# Patient Record
Sex: Female | Born: 1985 | Race: Black or African American | Hispanic: No | Marital: Single | Smoking: Never smoker
Health system: Southern US, Community
[De-identification: ages and names within clinical notes are randomized; demographics above are authoritative.]

## PROBLEM LIST (undated history)

## (undated) DIAGNOSIS — J45909 Unspecified asthma, uncomplicated: Secondary | ICD-10-CM

## (undated) DIAGNOSIS — R569 Unspecified convulsions: Secondary | ICD-10-CM

## (undated) HISTORY — DX: Morbid (severe) obesity due to excess calories: E66.01

## (undated) HISTORY — DX: Unspecified convulsions: R56.9

## (undated) HISTORY — DX: Unspecified asthma, uncomplicated: J45.909

---
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
## 2006-04-26 ENCOUNTER — Emergency Department (HOSPITAL_COMMUNITY): Admission: EM | Admit: 2006-04-26 | Discharge: 2006-04-26 | Payer: Self-pay | Admitting: Emergency Medicine

## 2007-04-06 ENCOUNTER — Emergency Department (HOSPITAL_COMMUNITY): Admission: EM | Admit: 2007-04-06 | Discharge: 2007-04-06 | Payer: Self-pay | Admitting: Emergency Medicine

## 2007-04-07 ENCOUNTER — Emergency Department (HOSPITAL_COMMUNITY): Admission: EM | Admit: 2007-04-07 | Discharge: 2007-04-07 | Payer: Self-pay | Admitting: Emergency Medicine

## 2008-11-05 ENCOUNTER — Emergency Department (HOSPITAL_COMMUNITY): Admission: EM | Admit: 2008-11-05 | Discharge: 2008-11-05 | Payer: Self-pay | Admitting: Family Medicine

## 2008-11-06 ENCOUNTER — Emergency Department (HOSPITAL_COMMUNITY): Admission: EM | Admit: 2008-11-06 | Discharge: 2008-11-06 | Payer: Self-pay | Admitting: Family Medicine

## 2011-06-20 ENCOUNTER — Encounter: Payer: Self-pay | Admitting: Internal Medicine

## 2011-06-20 DIAGNOSIS — Z0000 Encounter for general adult medical examination without abnormal findings: Secondary | ICD-10-CM | POA: Insufficient documentation

## 2011-06-24 ENCOUNTER — Other Ambulatory Visit (INDEPENDENT_AMBULATORY_CARE_PROVIDER_SITE_OTHER): Payer: BC Managed Care – PPO

## 2011-06-24 ENCOUNTER — Ambulatory Visit (INDEPENDENT_AMBULATORY_CARE_PROVIDER_SITE_OTHER): Payer: BC Managed Care – PPO | Admitting: Internal Medicine

## 2011-06-24 ENCOUNTER — Encounter: Payer: Self-pay | Admitting: Internal Medicine

## 2011-06-24 VITALS — BP 118/78 | HR 75 | Temp 98.5°F | Ht 67.0 in | Wt 339.2 lb

## 2011-06-24 DIAGNOSIS — Z0000 Encounter for general adult medical examination without abnormal findings: Secondary | ICD-10-CM

## 2011-06-24 DIAGNOSIS — R569 Unspecified convulsions: Secondary | ICD-10-CM | POA: Insufficient documentation

## 2011-06-24 HISTORY — DX: Morbid (severe) obesity due to excess calories: E66.01

## 2011-06-24 LAB — URINALYSIS, ROUTINE W REFLEX MICROSCOPIC
Bilirubin Urine: NEGATIVE
Ketones, ur: NEGATIVE
Total Protein, Urine: NEGATIVE
Urine Glucose: NEGATIVE
pH: 7 (ref 5.0–8.0)

## 2011-06-24 LAB — TSH: TSH: 1.8 u[IU]/mL (ref 0.35–5.50)

## 2011-06-24 LAB — LIPID PANEL
HDL: 49.1 mg/dL (ref >39.00)
Total CHOL/HDL Ratio: 4
Triglycerides: 101 mg/dL (ref 0.0–149.0)

## 2011-06-24 LAB — CBC WITH DIFFERENTIAL/PLATELET
Basophils Relative: 0.5 % (ref 0.0–3.0)
Eosinophils Relative: 1.6 % (ref 0.0–5.0)
HCT: 38.5 % (ref 36.0–46.0)
Lymphs Abs: 2.6 10*3/uL (ref 0.7–4.0)
MCV: 83.7 fl (ref 78.0–100.0)
Monocytes Absolute: 0.5 10*3/uL (ref 0.1–1.0)
Platelets: 210 10*3/uL (ref 150.0–400.0)
WBC: 9 10*3/uL (ref 4.5–10.5)

## 2011-06-24 LAB — HEPATIC FUNCTION PANEL
Albumin: 3.8 g/dL (ref 3.5–5.2)
Bilirubin, Direct: 0 mg/dL (ref 0.0–0.3)
Total Protein: 7.4 g/dL (ref 6.0–8.3)

## 2011-06-24 LAB — BASIC METABOLIC PANEL
CO2: 29 mEq/L (ref 19–32)
Calcium: 9.3 mg/dL (ref 8.4–10.5)
Creatinine, Ser: 0.9 mg/dL (ref 0.4–1.2)

## 2011-06-24 MED ORDER — PHENTERMINE HCL 37.5 MG PO CAPS: 37.5000 mg | ORAL_CAPSULE | ORAL | Status: DC

## 2011-06-24 NOTE — Progress Notes (Signed)
Subjective:    Patient ID: Alexis Vega, female    DOB: 03-Jan-1986, 26 y.o.   MRN: [id number]  HPI  Here to establish as new pt; has not had health ins until recent, had first pap smear dec 2012.  Unable to lose wt; has been approx 340 since approx 26 yo. Has tried  Sensa, zumba, walking 3 times per wk, exercise o/w 5 times per wk, tyring to eat more healthy, still cant lose. Does not want surgury.  Has not had thyroid check.   Overallo/w  doing ok;  Pt denies CP, worsening SOB, DOE, wheezing, orthopnea, PND, worsening LE edema, palpitations, dizziness or syncope.  Pt denies neurological change such as new Headache, facial or extremity weakness.  Pt denies polydipsia, polyuria, or low sugar symptoms. Pt states overall good compliance with treatment and medications, good tolerability, and trying to follow lower cholesterol diet.  Pt denies worsening depressive symptoms, suicidal ideation or panic. No fever, wt loss, night sweats, loss of appetite, or other constitutional symptoms.  Pt states good ability with ADL's, low fall risk, home safety reviewed and adequate, no significant changes in hearing or vision, and occasionally active with exercise. Not pregnant Past Medical History  Diagnosis Date  . Seizures     last siezure 26yo  . Morbid obesity 06/24/2011   History reviewed. No pertinent past surgical history.  reports that she has never smoked. She does not have any smokeless tobacco history on file. She reports that she drinks alcohol. She reports that she does not use illicit drugs. family history includes Seizures in her father. No Known Allergies No current outpatient prescriptions on file prior to visit.   Review of Systems Review of Systems  Constitutional: Negative for diaphoresis, activity change, appetite change and unexpected weight change.  HENT: Negative for hearing loss, ear pain, facial swelling, mouth sores and neck stiffness.   Eyes: Negative for pain, redness and visual  disturbance.  Respiratory: Negative for shortness of breath and wheezing.   Cardiovascular: Negative for chest pain and palpitations.  Gastrointestinal: Negative for diarrhea, blood in stool, abdominal distention and rectal pain.  Genitourinary: Negative for hematuria, flank pain and decreased urine volume.  Musculoskeletal: Negative for myalgias and joint swelling.  Skin: Negative for color change and wound.  Neurological: Negative for syncope and numbness.  Hematological: Negative for adenopathy.  Psychiatric/Behavioral: Negative for hallucinations, self-injury, decreased concentration and agitation.      Objective:   Physical Exam BP 118/78  Pulse 75  Temp(Src) 98.5 F (36.9 C) (Oral)  Ht 5\' 7"  (1.702 m)  Wt 339 lb 4 oz (153.883 kg)  BMI 53.13 kg/m2  SpO2 98%  LMP 06/15/2011 Physical Exam  VS noted Constitutional: Pt is oriented to person, place, and time. Appears well-developed and well-nourished.  HENT:  Head: Normocephalic and atraumatic.  Right Ear: External ear normal.  Left Ear: External ear normal.  Nose: Nose normal.  Mouth/Throat: Oropharynx is clear and moist.  Eyes: Conjunctivae and EOM are normal. Pupils are equal, round, and reactive to light.  Neck: Normal range of motion. Neck supple. No JVD present. No tracheal deviation present.  Cardiovascular: Normal rate, regular rhythm, normal heart sounds and intact distal pulses.   Pulmonary/Chest: Effort normal and breath sounds normal.  Abdominal: Soft. Bowel sounds are normal. There is no tenderness.  Musculoskeletal: Normal range of motion. Exhibits no edema.  Lymphadenopathy:  Has no cervical adenopathy.  Neurological: Pt is alert and oriented to person, place, and time. Pt has  normal reflexes. No cranial nerve deficit.  Skin: Skin is warm and dry. No rash noted.  Psychiatric:  Has  normal mood and affect. Behavior is normal.     Assessment & Plan:

## 2011-06-24 NOTE — Patient Instructions (Signed)
Take all new medications as prescribed Please go to LAB in the Basement for the blood and/or urine tests to be done today Please call the phone number [phone] (the PhoneTree System) for results of testing in 2-3 days;  When calling, simply dial the number, and when prompted enter the MRN number above (the Medical Record Number) and the # key, then the message should start. Please focus on increased activity, low cholesterol diet, and weight loss

## 2011-06-28 ENCOUNTER — Encounter: Payer: Self-pay | Admitting: Internal Medicine

## 2011-06-28 NOTE — Assessment & Plan Note (Signed)

## 2011-06-28 NOTE — Assessment & Plan Note (Signed)
Ok for limited phentermine asd, cont wt loss efforts with diet and excericse

## 2011-09-24 ENCOUNTER — Telehealth: Payer: Self-pay

## 2011-09-24 NOTE — Telephone Encounter (Signed)
Sorry, as discussed at her last visit, this is not meant to be a long term medication, and we have to stop due to risk of long term side effects

## 2011-09-24 NOTE — Telephone Encounter (Signed)
Patient would like refill on phentermine as states is working well. [facility] [address] Call back number [phone]

## 2011-09-24 NOTE — Telephone Encounter (Signed)
Patient informed of MD's instructions

## 2012-04-11 ENCOUNTER — Ambulatory Visit (INDEPENDENT_AMBULATORY_CARE_PROVIDER_SITE_OTHER): Payer: BC Managed Care – PPO | Admitting: Internal Medicine

## 2012-04-11 ENCOUNTER — Encounter: Payer: Self-pay | Admitting: Internal Medicine

## 2012-04-11 VITALS — BP 112/74 | HR 76 | Temp 98.7°F | Ht 67.5 in | Wt 290.2 lb

## 2012-04-11 DIAGNOSIS — R21 Rash and other nonspecific skin eruption: Secondary | ICD-10-CM | POA: Insufficient documentation

## 2012-04-11 MED ORDER — CLOTRIMAZOLE-BETAMETHASONE 1-0.05 % EX CREA: TOPICAL_CREAM | CUTANEOUS | Status: DC

## 2012-04-11 NOTE — Progress Notes (Signed)
  Subjective:    Patient ID: Alexis Vega, female    DOB: [birth year], 26 y.o.   MRN: [id number]  HPI  Here with mild 3 days onset itchy rash to the area below the left breast, with severa itchy bumps, no pain/fever/drainage or prior hx.  Has not tried any oTC med, nothing makes better or worse.  No obvious contact allergens.  Cousin recently tx for scabies, which has her concerned.  No other site of itch or rash Past Medical History  Diagnosis Date  . Seizures     last siezure 26yo  . Morbid obesity 06/24/2011   No past surgical history on file.  reports that she has never smoked. She does not have any smokeless tobacco history on file. She reports that she drinks alcohol. She reports that she does not use illicit drugs. family history includes Seizures in her father. No Known Allergies Current Outpatient Prescriptions on File Prior to Visit  Medication Sig Dispense Refill  . phentermine 37.5 MG capsule Take 1 capsule (37.5 mg total) by mouth every morning.  30 capsule  2   Review of Systems All otherwise neg per pt     Objective:   Physical Exam BP 112/74  Pulse 76  Temp 98.7 F (37.1 C) (Oral)  Ht 5' 7.5" (1.715 m)  Wt 290 lb 4 oz (131.657 kg)  BMI 44.79 kg/m2  SpO2 99% Physical Exam  VS noted Constitutional: Pt appears well-developed and well-nourished.  HENT: Head: Normocephalic.  Right Ear: External ear normal.  Left Ear: External ear normal.  Eyes: Conjunctivae and EOM are normal. Pupils are equal, round, and reactive to light.  Neck: Normal range of motion. Neck supple.  Cardiovascular: Normal rate and regular rhythm.   Pulmonary/Chest: Effort normal and breath sounds normal.  Neurological: Pt is alert. Not confused  Skin: 3 cm sq area below left breast with mild erythema, nontender with several nonpustular slight raised red 2-3 mm lesions Psychiatric: Pt behavior is normal. Thought content normal.     Assessment & Plan:

## 2012-04-11 NOTE — Assessment & Plan Note (Signed)
?   Contact vs other - for lotrisone cr prn asd,  to f/u any worsening symptoms or concerns, consider nystatin powder if not improved in 3-5 days

## 2012-04-11 NOTE — Patient Instructions (Addendum)
Take all new medications as prescribed Continue all other medications as before Please call for a change to nystatin powder if the rash is not better in 3-5 days Please remember to sign up for My Chart at your earliest convenience, as this will be important to you in the future with finding out test results. Please continue your efforts at being more active, low cholesterol diet, and weight control.

## 2012-07-30 ENCOUNTER — Other Ambulatory Visit: Payer: Self-pay

## 2012-08-26 ENCOUNTER — Encounter: Payer: BC Managed Care – PPO | Admitting: Internal Medicine

## 2012-09-13 ENCOUNTER — Encounter: Payer: BC Managed Care – PPO | Admitting: Internal Medicine

## 2012-10-24 ENCOUNTER — Emergency Department (HOSPITAL_COMMUNITY)
Admission: EM | Admit: 2012-10-24 | Discharge: 2012-10-24 | Disposition: A | Discharge status: Home / Self Care | Payer: BC Managed Care – PPO | Attending: Emergency Medicine | Admitting: Emergency Medicine

## 2012-10-24 ENCOUNTER — Encounter (HOSPITAL_COMMUNITY): Payer: Self-pay | Admitting: Emergency Medicine

## 2012-10-24 DIAGNOSIS — J020 Streptococcal pharyngitis: Secondary | ICD-10-CM | POA: Insufficient documentation

## 2012-10-24 DIAGNOSIS — R509 Fever, unspecified: Secondary | ICD-10-CM | POA: Insufficient documentation

## 2012-10-24 DIAGNOSIS — G40909 Epilepsy, unspecified, not intractable, without status epilepticus: Secondary | ICD-10-CM | POA: Insufficient documentation

## 2012-10-24 LAB — RAPID STREP SCREEN (MED CTR MEBANE ONLY): Streptococcus, Group A Screen (Direct): POSITIVE — AB

## 2012-10-24 MED ORDER — HYDROCODONE-ACETAMINOPHEN 7.5-325 MG/15ML PO SOLN: 15.0000 mL | Freq: Four times a day (QID) | ORAL | Status: DC | PRN

## 2012-10-24 MED ORDER — OXYCODONE-ACETAMINOPHEN 5-325 MG PO TABS
1.0000 | ORAL_TABLET | Freq: Once | ORAL | Status: AC
  Administered 2012-10-24: 1 via ORAL
  Filled 2012-10-24: qty 1

## 2012-10-24 MED ORDER — PENICILLIN G BENZATHINE 1200000 UNIT/2ML IM SUSP
1.2000 10*6.[IU] | Freq: Once | INTRAMUSCULAR | Status: AC
  Administered 2012-10-24: 1.2 10*6.[IU] via INTRAMUSCULAR
  Filled 2012-10-24: qty 2

## 2012-10-24 MED ORDER — ACETAMINOPHEN 325 MG PO TABS
650.0000 mg | ORAL_TABLET | Freq: Once | ORAL | Status: AC
  Administered 2012-10-24: 650 mg via ORAL
  Filled 2012-10-24: qty 2

## 2012-10-24 NOTE — ED Notes (Signed)
Pt here for sore throat x 2 days with fever; pt diagnosed with strep at work office but here for meds

## 2012-10-24 NOTE — ED Provider Notes (Signed)
History  This chart was scribed for [employer]. Rubin Payor, MD by Ardeen Jourdain, ED Scribe. This patient was seen in room TR09C/TR09C and the patient's care was started at 1903.  CSN: [id number]  Arrival date & time 10/24/12  1708   First MD Initiated Contact with Patient 10/24/12 1903      Chief Complaint  Patient presents with  . Sore Throat     The history is provided by the patient. No language interpreter was used.    HPI Comments: Alexis Vega is a 27 y.o. female who presents to the Emergency Department complaining of gradual onset, gradually worsening, constant sore throat with associated fever that began yesterday. She states she looked at the back of her throat yesterday and saw a "pus filled pocket." She states she was evaluated at work and the nurse stated she had strep throat but was unable to prescribe medication. She admits to sick contact. She denies any nausea and emesis.   Past Medical History  Diagnosis Date  . Seizures     last siezure 27yo  . Morbid obesity 06/24/2011    History reviewed. No pertinent past surgical history.  Family History  Problem Relation Age of Onset  . Seizures Father     History  Substance Use Topics  . Smoking status: Never Smoker   . Smokeless tobacco: Not on file  . Alcohol Use: Yes     Comment: social   No OB history available.   Review of Systems  Constitutional: Positive for fever and chills.  HENT: Positive for sore throat. Negative for trouble swallowing.   Gastrointestinal: Negative for nausea and vomiting.  All other systems reviewed and are negative.    Allergies  Shrimp and Strawberry  Home Medications   Current Outpatient Rx  Name  Route  Sig  Dispense  Refill  . HYDROcodone-acetaminophen (HYCET) 7.5-325 mg/15 ml solution   Oral   Take 15 mLs by mouth every 6 (six) hours as needed for pain.   120 mL   0     Triage Vitals: BP 146/89  Pulse 101  Temp(Src) 102.1 F (38.9 C) (Oral)  Resp 18  Ht 5'  7" (1.702 m)  Wt 290 lb (131.543 kg)  BMI 45.41 kg/m2  SpO2 100%  Physical Exam  Nursing note and vitals reviewed. Constitutional: She is oriented to person, place, and time. She appears well-developed and well-nourished. No distress.  HENT:  Head: Normocephalic and atraumatic.  Mouth/Throat: Oropharyngeal exudate present.  Tonsillar exudate bilaterally. No peritonsillar swelling. No change of voice.   Eyes: EOM are normal. Pupils are equal, round, and reactive to light.  Neck: Normal range of motion. Neck supple. No tracheal deviation present.  Mild cervical lymphadenopathy   Cardiovascular: Normal rate and normal heart sounds.  Exam reveals no gallop and no friction rub.   No murmur heard. Mild tachycardia   Pulmonary/Chest: Effort normal and breath sounds normal. No respiratory distress. She has no wheezes. She has no rales. She exhibits no tenderness.  Abdominal: Soft. She exhibits no distension.  Musculoskeletal: Normal range of motion. She exhibits no edema.  Lymphadenopathy:    She has cervical adenopathy.  Neurological: She is alert and oriented to person, place, and time.  Skin: Skin is warm and dry.  Psychiatric: She has a normal mood and affect. Her behavior is normal.    ED Course  Procedures (including critical care time)  7:00 PM-Discussed treatment plan which includes rapid strep screen, antibiotics and pain medication  with pt at bedside and pt agreed to plan.    Labs Reviewed  RAPID STREP SCREEN - Abnormal; Notable for the following:    Streptococcus, Group A Screen (Direct) POSITIVE (*)    All other components within normal limits   No results found.   1. Strep pharyngitis       MDM  Patient presents with sore throat. Positive strep test as an outpatient and repeated here under protocol. No sign of peritonsillar abscess. Given penicillin shot and we'll given pain control.      I personally performed the services described in this documentation,  which was scribed in my presence. The recorded information has been reviewed and is accurate.     Juliet Rude. Rubin Payor, MD 10/24/12 1930

## 2012-10-24 NOTE — ED Notes (Signed)
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

## 2013-04-20 ENCOUNTER — Other Ambulatory Visit: Payer: Self-pay

## 2017-06-10 ENCOUNTER — Ambulatory Visit (INDEPENDENT_AMBULATORY_CARE_PROVIDER_SITE_OTHER): Payer: BLUE CROSS/BLUE SHIELD

## 2017-06-10 ENCOUNTER — Ambulatory Visit: Payer: BLUE CROSS/BLUE SHIELD | Admitting: Podiatry

## 2017-06-10 ENCOUNTER — Encounter: Payer: Self-pay | Admitting: Podiatry

## 2017-06-10 DIAGNOSIS — M79672 Pain in left foot: Secondary | ICD-10-CM

## 2017-06-10 DIAGNOSIS — M79671 Pain in right foot: Secondary | ICD-10-CM

## 2017-06-10 DIAGNOSIS — M722 Plantar fascial fibromatosis: Secondary | ICD-10-CM | POA: Diagnosis not present

## 2017-06-10 DIAGNOSIS — M6740 Ganglion, unspecified site: Secondary | ICD-10-CM | POA: Diagnosis not present

## 2017-06-10 DIAGNOSIS — Z6841 Body Mass Index (BMI) 40.0 and over, adult: Secondary | ICD-10-CM | POA: Insufficient documentation

## 2017-06-10 DIAGNOSIS — B372 Candidiasis of skin and nail: Secondary | ICD-10-CM | POA: Insufficient documentation

## 2017-06-10 DIAGNOSIS — E669 Obesity, unspecified: Secondary | ICD-10-CM | POA: Insufficient documentation

## 2017-06-10 MED ORDER — MELOXICAM 15 MG PO TABS: 15.0000 mg | ORAL_TABLET | Freq: Every day | ORAL | 2 refills | Status: AC

## 2017-06-10 MED ORDER — METHYLPREDNISOLONE 4 MG PO TBPK: ORAL_TABLET | ORAL | 0 refills | Status: DC

## 2017-06-10 NOTE — Patient Instructions (Signed)
Start with the medrol dose pack (steroid), once complete you can start the Meloxicam (mobic).   Plantar Fasciitis Rehab Ask your health care provider which exercises are safe for you. Do exercises exactly as told by your health care provider and adjust them as directed. It is normal to feel mild stretching, pulling, tightness, or discomfort as you do these exercises, but you should stop right away if you feel sudden pain or your pain gets worse. Do not begin these exercises until told by your health care provider. Stretching and range of motion exercises These exercises warm up your muscles and joints and improve the movement and flexibility of your foot. These exercises also help to relieve pain. Exercise A: Plantar fascia stretch  1. Sit with your left / right leg crossed over your opposite knee. 2. Hold your heel with one hand with that thumb near your arch. With your other hand, hold your toes and gently pull them back toward the top of your foot. You should feel a stretch on the bottom of your toes or your foot or both. 3. Hold this stretch for__________ seconds. 4. Slowly release your toes and return to the starting position. Repeat __________ times. Complete this exercise __________ times a day. Exercise B: Gastroc, standing  1. Stand with your hands against a wall. 2. Extend your left / right leg behind you, and bend your front knee slightly. 3. Keeping your heels on the floor and keeping your back knee straight, shift your weight toward the wall without arching your back. You should feel a gentle stretch in your left / right calf. 4. Hold this position for __________ seconds. Repeat __________ times. Complete this exercise __________ times a day. Exercise C: Soleus, standing 1. Stand with your hands against a wall. 2. Extend your left / right leg behind you, and bend your front knee slightly. 3. Keeping your heels on the floor, bend your back knee and slightly shift your weight over  the back leg. You should feel a gentle stretch deep in your calf. 4. Hold this position for __________ seconds. Repeat __________ times. Complete this exercise __________ times a day. Exercise D: Gastrocsoleus, standing 1. Stand with the ball of your left / right foot on a step. The ball of your foot is on the walking surface, right under your toes. 2. Keep your other foot firmly on the same step. 3. Hold onto the wall or a railing for balance. 4. Slowly lift your other foot, allowing your body weight to press your heel down over the edge of the step. You should feel a stretch in your left / right calf. 5. Hold this position for __________ seconds. 6. Return both feet to the step. 7. Repeat this exercise with a slight bend in your left / right knee. Repeat __________ times with your left / right knee straight and __________ times with your left / right knee bent. Complete this exercise __________ times a day. Balance exercise This exercise builds your balance and strength control of your arch to help take pressure off your plantar fascia. Exercise E: Single leg stand 1. Without shoes, stand near a railing or in a doorway. You may hold onto the railing or door frame as needed. 2. Stand on your left / right foot. Keep your big toe down on the floor and try to keep your arch lifted. Do not let your foot roll inward. 3. Hold this position for __________ seconds. 4. If this exercise is too easy, you  can try it with your eyes closed or while standing on a pillow. Repeat __________ times. Complete this exercise __________ times a day. This information is not intended to replace advice given to you by your health care provider. Make sure you discuss any questions you have with your health care provider. Document Released: 06/01/2005 Document Revised: 02/04/2016 Document Reviewed: 04/15/2015 Elsevier Interactive Patient Education  2018 [employer].

## 2017-06-10 NOTE — Progress Notes (Signed)
Subjective:    Patient ID: Alexis Vega, female    DOB: 03-26-1986, 31 y.o.   MRN: [id number]  HPI 31 year old female presents the office today for concerns of pain to the dorsal aspect of the foot as well as swelling which is been ongoing for 8 months.  She states that what will happen if she develops a cyst on top of her foot which does not hurt as it is growing however it does pop spontaneously that when she gets the pain to the foot.  She states that she is having no pain today and she has not noticed any swelling or any fluid collection present.  She denies any redness and warmth and she denies any recent injury or trauma.  She also gets some pain in the bottom of her left heel which is been ongoing for the last 2-3 months.  She states the pain is worse in the morning when she first gets up or after rest and she stands back up.  She denies any recent injury or trauma she said no treatment for this.  She denies any swelling or redness.  She has no other concerns.   Review of Systems  All other systems reviewed and are negative.  Past Medical History:  Diagnosis Date  . Morbid obesity (HCC) 06/24/2011  . Seizures (HCC)    last siezure 31yo    History reviewed. No pertinent surgical history.   Current Outpatient Medications:  .  clindamycin (CLEOCIN T) 1 % external solution, clindamycin phosphate 1 % topical solution, Disp: , Rfl:  .  HYDROcodone-acetaminophen (HYCET) 7.5-325 mg/15 ml solution, Take 15 mLs by mouth every 6 (six) hours as needed for pain., Disp: 120 mL, Rfl: 0 .  ibuprofen (ADVIL) 200 MG tablet, Advil 200 mg tablet  Take 1 tablet every 6 hours by oral route., Disp: , Rfl:  .  phentermine (ADIPEX-P) 37.5 MG tablet, Adipex-P 37.5 mg tablet  Take 1 tablet every day by oral route in the morning., Disp: , Rfl:  .  tretinoin (RETIN-A) 0.1 % cream, tretinoin 0.1 % topical cream, Disp: , Rfl:  .  meloxicam (MOBIC) 15 MG tablet, Take 1 tablet (15 mg total) by mouth daily., Disp:  30 tablet, Rfl: 2 .  methylPREDNISolone (MEDROL DOSEPAK) 4 MG TBPK tablet, Take as directed, Disp: 21 tablet, Rfl: 0  Allergies  Allergen Reactions  . Shrimp [Shellfish Allergy] Itching and Swelling  . Strawberry Extract Hives    Social History   Socioeconomic History  . Marital status: Single    Spouse name: Not on file  . Number of children: Not on file  . Years of education: 5212  . Highest education level: Not on file  Social Needs  . Financial resource strain: Not on file  . Food insecurity - worry: Not on file  . Food insecurity - inability: Not on file  . Transportation needs - medical: Not on file  . Transportation needs - non-medical: Not on file  Occupational History  . Occupation: Imaging professional  Tobacco Use  . Smoking status: Never Smoker  Substance and Sexual Activity  . Alcohol use: Yes    Comment: social  . Drug use: No  . Sexual activity: Not on file  Other Topics Concern  . Not on file  Social History Narrative  . Not on file        Objective:   Physical Exam  General: AAO x3, NAD  Dermatological: Skin is warm, dry and supple bilateral.  Nails x 10 are well manicured; remaining integument appears unremarkable at this time. There are no open sores, no preulcerative lesions, no rash or signs of infection present.  Vascular: Dorsalis Pedis artery and Posterior Tibial artery pedal pulses are 2/4 bilateral with immedate capillary fill time. There is no pain with calf compression, swelling, warmth, erythema.   Neruologic: Grossly intact via light touch bilateral. Vibratory intact via tuning fork bilateral. Protective threshold with Semmes Wienstein monofilament intact to all pedal sites bilateral.  Negative Tinel sign is present.  Musculoskeletal: There is minimal tenderness palpation on the plantar medial tubercle of the calcaneus at the insertion of the plantar fascia on the left heel.  No pain on the course the plantar fascia in the arch of the  foot.  The fascia appears to be intact.  Achilles tendon appears to be intact.  There is no pain to the dorsal aspect of the foot identified today however subjectively on the dorsal midfoot is where she gets discomfort.  There is no evidence of any cystic formation today.  There is no edema, erythema, increase in warmth.  Muscular strength 5/5 in all groups tested bilateral.  Decreased medial arch height.  Gait: Unassisted, Nonantalgic.      Assessment & Plan:  31 year old female with left heel pain likely result of plantar fasciitis; likely ganglion cyst bilateral dorsal feet -Treatment options discussed including all alternatives, risks, and complications -Etiology of symptoms were discussed -X-rays were obtained and reviewed with the patient. There is no evidence of acute fracture identified today. -In regards to the dorsal midfoot there is no pain today and there is no signs of any cystic formation.  We will continue to monitor this.  Discussed with her aspiration should they come back. -Discussed a steroid injection of the heel but she declined this.  I ordered a Medrol Dosepak.  Once this is complete she can start the meloxicam.  Discussed side effects and did not get gadolinium. -Long-term I think she will benefit from custom molded orthotics.  We will check with insurance coverage for this. -Ice and elevation -Stretching, icing exercises daily -Discussed shoe modifications  Vivi BarrackMatthew R Wagoner DPM

## 2019-07-20 ENCOUNTER — Ambulatory Visit: Payer: Self-pay | Admitting: Family Medicine

## 2019-08-23 ENCOUNTER — Other Ambulatory Visit: Payer: Self-pay

## 2019-08-24 ENCOUNTER — Ambulatory Visit: Payer: 59 | Admitting: Family Medicine

## 2019-08-24 ENCOUNTER — Encounter: Payer: Self-pay | Admitting: Family Medicine

## 2019-08-24 VITALS — BP 128/82 | HR 82 | Temp 97.9°F | Wt 355.0 lb

## 2019-08-24 DIAGNOSIS — E049 Nontoxic goiter, unspecified: Secondary | ICD-10-CM | POA: Diagnosis not present

## 2019-08-24 DIAGNOSIS — Z87898 Personal history of other specified conditions: Secondary | ICD-10-CM

## 2019-08-24 DIAGNOSIS — Z8709 Personal history of other diseases of the respiratory system: Secondary | ICD-10-CM

## 2019-08-24 DIAGNOSIS — L918 Other hypertrophic disorders of the skin: Secondary | ICD-10-CM | POA: Diagnosis not present

## 2019-08-24 DIAGNOSIS — Z7689 Persons encountering health services in other specified circumstances: Secondary | ICD-10-CM

## 2019-08-24 DIAGNOSIS — L83 Acanthosis nigricans: Secondary | ICD-10-CM | POA: Diagnosis not present

## 2019-08-24 LAB — T4, FREE: Free T4: 0.9 ng/dL (ref 0.60–1.60)

## 2019-08-24 LAB — HEMOGLOBIN A1C: Hgb A1c MFr Bld: 5.8 % (ref 4.6–6.5)

## 2019-08-24 LAB — TSH: TSH: 1.13 u[IU]/mL (ref 0.35–4.50)

## 2019-08-24 NOTE — Progress Notes (Signed)
Patient presents to clinic today to establish care.  SUBJECTIVE: PMH: Pt is a 34 yo female with pmh sig for h/o asthma, obesity, h/o seizures.  Pt did not previously have a pcp.  H/o Asthma: -no recent symptoms.   -does not have an inhaler -last asthma attack was in grade school.  H/o seizure d/o: -endorses remote hx.   -was on phenobarbital at age 53 -not currently on meds -last seizure was in grade school  Skin tags: -pt notes several on neck -interested in what can be done about them.  Allergies:  Strawberries- hives Shrimp-itching, edema  Social hx: Pt is single.  She is a HS grad.   Working on increasing physical activity.  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  Pt endorses social EtOH use.  Pt denies tobacco and drug use.   Health Maintenance: Dental --  Dr. Gloriann Loan PAP -- March 2020, Dr. Stann Mainland, [facility]  Family Medical hx: Mom-colon cancer s/p partial resection and chemo, HTN Dad-HTN   Past Medical History:  Diagnosis Date  . Asthma   . Morbid obesity ([place]) 06/24/2011  . Seizures ([place])    last siezure 34yo    History reviewed. No pertinent surgical history.  Current Outpatient Medications on File Prior to Visit  Medication Sig Dispense Refill  . ibuprofen (ADVIL) 200 MG tablet Advil 200 mg tablet  Take 1 tablet every 6 hours by oral route.     No current facility-administered medications on file prior to visit.    Allergies  Allergen Reactions  . Shrimp [Shellfish Allergy] Itching and Swelling  . Strawberry Extract Hives    Family History  Problem Relation Age of Onset  . Seizures Father   . Hypertension Mother   . Cancer Mother   . Hypertension Sister     Social History   Socioeconomic History  . Marital status: Single    Spouse name: Not on file  . Number of children: Not on file  . Years of education: 78  . Highest education level: Not on file  Occupational History  . Occupation: Imaging  professional  Tobacco Use  . Smoking status: Never Smoker  . Smokeless tobacco: Never Used  Substance and Sexual Activity  . Alcohol use: Yes    Comment: social  . Drug use: No  . Sexual activity: Yes  Other Topics Concern  . Not on file  Social History Narrative  . Not on file   Social Determinants of Health   Financial Resource Strain:   . Difficulty of Paying Living Expenses:   Food Insecurity:   . Worried About Charity fundraiser in the Last Year:   . Arboriculturist in the Last Year:   Transportation Needs:   . Film/video editor (Medical):   Marland Kitchen Lack of Transportation (Non-Medical):   Physical Activity:   . Days of Exercise per Week:   . Minutes of Exercise per Session:   Stress:   . Feeling of Stress :   Social Connections:   . Frequency of Communication with Friends and Family:   . Frequency of Social Gatherings with Friends and Family:   . Attends Religious Services:   . Active Member of Clubs or Organizations:   . Attends Archivist Meetings:   Marland Kitchen Marital Status:   Intimate Partner Violence:   . Fear of Current or Ex-Partner:   . Emotionally Abused:   Marland Kitchen Physically Abused:   . Sexually  Abused:     ROS General: Denies fever, chills, night sweats, changes in weight, changes in appetite HEENT: Denies headaches, ear pain, changes in vision, rhinorrhea, sore throat CV: Denies CP, palpitations, SOB, orthopnea Pulm: Denies SOB, cough, wheezing GI: Denies abdominal pain, nausea, vomiting, diarrhea, constipation GU: Denies dysuria, hematuria, frequency, vaginal discharge Msk: Denies muscle cramps, joint pains Neuro: Denies weakness, numbness, tingling Skin: Denies rashes, bruising  +skin tags of neck Psych: Denies depression, anxiety, hallucinations  BP 128/82 (BP Location: Left Arm, Patient Position: Sitting, Cuff Size: Large)   Pulse 82   Temp 97.9 F (36.6 C) (Temporal)   Wt (!) 355 lb (161 kg)   LMP 08/13/2019 (Exact Date)   SpO2 98%   BMI  55.60 kg/m   Physical Exam Gen. Pleasant, well developed, well-nourished, in NAD HEENT - [place]/AT, PERRL, EOMI, no scleral icterus, no nasal drainage.  Mild thyromegaly b/l, smooth, no masses or nodules noted. Lungs: no use of accessory muscles, CTAB diminished 2/2 body habitus , no wheezes, rales or rhonchi Cardiovascular: RRR, No r/g/m, no peripheral edema Abdomen: BS present, soft, nontender,nondistended Musculoskeletal: No deformities, moves all four extremities, no cyanosis or clubbing, normal tone Neuro:  A&Ox3, CN II-XII intact, normal gait Skin:  Warm, dry, intact.  Mild acanthosis nigricans of posterior neck, skin tags of b/l neck    No results found for this or any previous visit (from the past 2160 hour(s)).  Assessment/Plan: Goiter  -given handout -will obtain labs. -if needed will order u/s - Plan: TSH, T4, Free  Skin tags, multiple acquired  -discussed removal options. -given handout - Plan: Hemoglobin A1c  Acanthosis nigricans  - Plan: Hemoglobin A1c  History of asthma -stable without meds  History of seizures -stable without meds -no recent seizure activity   Encounter to establish care -We reviewed the PMH, PSH, FH, SH, Meds and Allergies. -We provided refills for any medications we will prescribe as needed. -We addressed current concerns per orders and patient instructions. -We have asked for records for pertinent exams, studies, vaccines and notes from previous providers. -We have advised patient to follow up per instructions below.  F/u prn for skin tag removal.  Abbe Amsterdam, MD

## 2019-08-24 NOTE — Patient Instructions (Signed)
Goiter  A goiter is an enlarged thyroid gland. The thyroid is located in the lower front of the neck. It makes hormones that affect many body parts and systems, including the system that affects how quickly the body burns fuel for energy (metabolism). Most goiters are painless and are not a cause for concern. Some goiters can affect the way your thyroid makes thyroid hormones. Goiters and conditions that cause goiters can be treated, if necessary. What are the causes? Common causes of this condition include:  Lack (deficiency) of a mineral called iodine. The thyroid gland uses iodine to make thyroid hormones.  Diseases that attack healthy cells in the body (autoimmune diseases) and affect thyroid function, such as Graves' disease or Hashimoto's disease. These diseases may cause the body to produce too much thyroid hormone (hyperthyroidism) or too little of the hormone (hypothyroidism).  Conditions that cause inflammation of the thyroid (thyroiditis).  One or more small growths on the thyroid (nodular goiter). Other causes include:  Medical problems caused by abnormal genes that are passed from parent to child (genetic defects).  Thyroid injury or infection.  Tumors that may or may not be cancerous.  Pregnancy.  Certain medicines.  Exposure to radiation. In some cases, the cause may not be known. What increases the risk? This condition is more likely to develop in:  People who do not get enough iodine in their diet.  People who have a family history of goiter.  Women.  People who are older than age 40.  People who smoke tobacco.  People who have had exposure to radiation. What are the signs or symptoms? The main symptom of this condition is swelling in the lower, front part of the neck. This swelling can range from a very small bump to a large lump. Other symptoms may include:  A tight feeling in the throat.  A hoarse voice.  Coughing.  Wheezing.  Difficulty  swallowing or breathing.  Bulging veins in the neck.  Dizziness. When a goiter is the result of an overactive thyroid (hyperthyroidism), symptoms may also include:  Nervousness or restlessness.  Inability to tolerate heat.  Unexplained weight loss.  Diarrhea.  Change in the texture of hair or skin.  Changes in heartbeat, such as skipped beats, extra beats, or a rapid heart rate.  Loss of menstruation.  Shaky hands.  Increased appetite.  Sleep problems. When a goiter is the result of an underactive thyroid (hypothyroidism), symptoms may also include:  Feeling like you have no energy (lethargy).  Inability to tolerate cold.  Weight gain that is not explained by a change in diet or exercise habits.  Dry skin.  Coarse hair.  Irregular menstrual periods.  Constipation.  Sadness or depression.  Fatigue. In some cases, there may not be any symptoms and the thyroid hormone levels may be normal. How is this diagnosed? This condition may be diagnosed based on your symptoms, your medical history, and a physical exam. You may have tests, such as:  Blood tests to check thyroid function.  Imaging tests, such as: ? Ultrasound. ? CT scan. ? MRI. ? Thyroid scan.  Removal of a tissue sample (biopsy) of the goiter or any nodules. The sample will be tested to check for cancer. How is this treated? Treatment for this condition depends on the cause and your symptoms. Treatment may include:  Medicines to regulate thyroid hormone levels.  Anti-inflammatory medicines or steroid medicines, if the goiter is caused by inflammation.  Iodine supplements or changes to your   diet, if the goiter is caused by iodine deficiency.  Radioactive iodine treatment.  Surgery to remove your thyroid. In some cases, you may only need regular check-ups with your health care provider to monitor your condition, and you may not need treatment. Follow these instructions at home:  Follow  instructions from your health care provider about any changes to your diet.  Take over-the-counter and prescription medicines only as told by your health care provider. These include supplements.  Do not use any products that contain nicotine or tobacco, such as cigarettes and e-cigarettes. If you need help quitting, ask your health care provider.  Keep all follow-up visits as told by your health care provider. This is important. Contact a health care provider if:  Your symptoms do not get better with treatment.  You have nausea, vomiting, or diarrhea. Get help right away if:  You have sudden, unexplained confusion or other mental changes.  You have a fever.  You have chest pain.  You have trouble breathing or swallowing.  You suddenly become very weak.  You experience extreme restlessness.  You feel your heart racing. Summary  A goiter is an enlarged thyroid gland.  The thyroid gland is located in the lower front of the neck. It makes hormones that affect many body parts and systems, including the system that affects how quickly the body burns fuel for energy (metabolism).  The main symptom of this condition is swelling in the lower, front part of the neck. This swelling can range from a very small bump to a large lump.  Treatment for this condition depends on the cause and your symptoms. You may need medicines, supplements, or regular monitoring of your condition. This information is not intended to replace advice given to you by your health care provider. Make sure you discuss any questions you have with your health care provider. Document Revised: 05/14/2017 Document Reviewed: 02/25/2017 Elsevier Patient Education  2020 [place], Adult  A skin tag (acrochordon) is a soft, extra growth of skin. Most skin tags are flesh-colored and rarely bigger than a pencil eraser. They commonly form near areas where there are folds in the skin, such as the armpit or  groin. Skin tags are not dangerous, and they do not spread from person to person (are not contagious). You may have one skin tag or several. Skin tags do not require treatment. However, your health care provider may recommend removal of a skin tag if it:  Gets irritated from clothing.  Bleeds.  Is visible and unsightly. Your health care provider can remove skin tags with a simple surgical procedure or a procedure that involves freezing the skin tag. Follow these instructions at home:  Watch for any changes in your skin tag. A normal skin tag does not require any other special care at home.  Take over-the-counter and prescription medicines only as told by your health care provider.  Keep all follow-up visits as told by your health care provider. This is important. Contact a health care provider if:  You have a skin tag that: ? Becomes painful. ? Changes color. ? Bleeds. ? Swells.  You develop more skin tags. This information is not intended to replace advice given to you by your health care provider. Make sure you discuss any questions you have with your health care provider. Document Revised: 05/14/2017 Document Reviewed: 06/16/2015 Elsevier Patient Education  2020 [place].  Acanthosis Nigricans Acanthosis nigricans is a condition in which dark, velvety markings appear  on the skin. What are the causes? This condition may be caused by:  A hormonal or glandular disorder, such as diabetes.  Obesity.  Certain medicines, such as birth control pills.  A tumor. This is rare. Some people inherit the condition from their parents. What increases the risk? You are more likely to develop this condition if you:  Have a hormonal or glandular disorder.  Are overweight.  Take certain medicines.  Have certain cancers, especially stomach cancer.  Have dark-colored skin (dark complexion). What are the signs or symptoms? The main symptom of this condition is velvety markings  on the skin that are light brown, black, or grayish in color.  The markings usually appear on the face. They may also appear in skin fold areas at the neck, armpits, inner thighs, and groin.  In severe cases, markings may also appear on the lips, hands, breasts, eyelids, and mouth. How is this diagnosed? This condition may be diagnosed based on your symptoms.  A skin sample may be removed for testing (skin biopsy).  You may also have tests to help determine the cause of the condition. How is this treated? Treatment for this condition depends on the cause. Treatment may involve reducing insulin levels, which are often high in people who have this condition. Insulin levels can be reduced with:  Dietary changes, such as avoiding starchy foods and sugars.  Losing weight.  Medicines. Sometimes, treatment involves:  Medicines to improve the appearance of the skin.  Laser treatment to improve the appearance of the skin.  Surgical removal of the skin markings (dermabrasion). Follow these instructions at home:  Follow diet instructions from your health care provider.  Lose weight if you are overweight.  Take over-the-counter and prescription medicines only as told by your health care provider.  Keep all follow-up visits as told by your health care provider. This is important. Contact a health care provider if:  New skin markings develop on a part of the body where they rarely develop, such as on your lips, hands, breasts, eyelids, or mouth.  The condition recurs for an unknown reason. Summary  Acanthosis nigricans is a condition in which dark, velvety markings appear on the skin.  Treatment for this condition depends on the cause. Treatment may include dietary changes, medicines, laser treatment, or surgery.  Take over-the-counter and prescription medicines only as told by your health care provider.  Contact a health care provider if new skin markings develop on a part of the  body where they rarely develop, such as on your lips, hands, breasts, eyelids, or mouth.  Keep all follow-up visits as told by your health care provider. This is important. This information is not intended to replace advice given to you by your health care provider. Make sure you discuss any questions you have with your health care provider. Document Revised: 10/11/2017 Document Reviewed: 10/11/2017 Elsevier Patient Education  2020 [employer].

## 2019-08-28 ENCOUNTER — Encounter: Payer: Self-pay | Admitting: Family Medicine

## 2019-08-28 DIAGNOSIS — Z8709 Personal history of other diseases of the respiratory system: Secondary | ICD-10-CM | POA: Insufficient documentation

## 2019-08-28 DIAGNOSIS — L83 Acanthosis nigricans: Secondary | ICD-10-CM | POA: Insufficient documentation

## 2019-08-28 DIAGNOSIS — L918 Other hypertrophic disorders of the skin: Secondary | ICD-10-CM | POA: Insufficient documentation

## 2019-08-28 DIAGNOSIS — Z87898 Personal history of other specified conditions: Secondary | ICD-10-CM | POA: Insufficient documentation

## 2019-08-28 DIAGNOSIS — E049 Nontoxic goiter, unspecified: Secondary | ICD-10-CM | POA: Insufficient documentation

## 2019-09-20 ENCOUNTER — Other Ambulatory Visit: Payer: Self-pay

## 2019-09-21 ENCOUNTER — Encounter: Payer: Self-pay | Admitting: Family Medicine

## 2019-09-21 ENCOUNTER — Ambulatory Visit (INDEPENDENT_AMBULATORY_CARE_PROVIDER_SITE_OTHER): Payer: 59 | Admitting: Family Medicine

## 2019-09-21 VITALS — BP 128/96 | HR 96 | Temp 97.9°F | Wt 354.0 lb

## 2019-09-21 DIAGNOSIS — M25561 Pain in right knee: Secondary | ICD-10-CM

## 2019-09-21 DIAGNOSIS — M5441 Lumbago with sciatica, right side: Secondary | ICD-10-CM

## 2019-09-21 MED ORDER — CYCLOBENZAPRINE HCL 5 MG PO TABS: 5.0000 mg | ORAL_TABLET | Freq: Three times a day (TID) | ORAL | 1 refills | Status: DC | PRN

## 2019-09-21 MED ORDER — MELOXICAM 15 MG PO TABS: 15.0000 mg | ORAL_TABLET | Freq: Every day | ORAL | 0 refills | Status: DC

## 2019-09-21 NOTE — Patient Instructions (Signed)
Sciatica  Sciatica is pain, numbness, weakness, or tingling along the path of the sciatic nerve. The sciatic nerve starts in the lower back and runs down the back of each leg. The nerve controls the muscles in the lower leg and in the back of the knee. It also provides feeling (sensation) to the back of the thigh, the lower leg, and the sole of the foot. Sciatica is a symptom of another medical condition that pinches or puts pressure on the sciatic nerve. Sciatica most often only affects one side of the body. Sciatica usually goes away on its own or with treatment. In some cases, sciatica may come back (recur). What are the causes? This condition is caused by pressure on the sciatic nerve or pinching of the nerve. This may be the result of:  A disk in between the bones of the spine bulging out too far (herniated disk).  Age-related changes in the spinal disks.  A pain disorder that affects a muscle in the buttock.  Extra bone growth near the sciatic nerve.  A break (fracture) of the pelvis.  Pregnancy.  Tumor. This is rare. What increases the risk? The following factors may make you more likely to develop this condition:  Playing sports that place pressure or stress on the spine.  Having poor strength and flexibility.  A history of back injury or surgery.  Sitting for long periods of time.  Doing activities that involve repetitive bending or lifting.  Obesity. What are the signs or symptoms? Symptoms can vary from mild to very severe, and they may include:  Any of these problems in the lower back, leg, hip, or buttock: ? Mild tingling, numbness, or dull aches. ? Burning sensations. ? Sharp pains.  Numbness in the back of the calf or the sole of the foot.  Leg weakness.  Severe back pain that makes movement difficult. Symptoms may get worse when you cough, sneeze, or laugh, or when you sit or stand for long periods of time. How is this diagnosed? This condition may be  diagnosed based on:  Your symptoms and medical history.  A physical exam.  Blood tests.  Imaging tests, such as: ? X-rays. ? MRI. ? CT scan. How is this treated? In many cases, this condition improves on its own without treatment. However, treatment may include:  Reducing or modifying physical activity.  Exercising and stretching.  Icing and applying heat to the affected area.  Medicines that help to: ? Relieve pain and swelling. ? Relax your muscles.  Injections of medicines that help to relieve pain, irritation, and inflammation around the sciatic nerve (steroids).  Surgery. Follow these instructions at home: Medicines  Take over-the-counter and prescription medicines only as told by your health care provider.  Ask your health care provider if the medicine prescribed to you: ? Requires you to avoid driving or using heavy machinery. ? Can cause constipation. You may need to take these actions to prevent or treat constipation:  Drink enough fluid to keep your urine pale yellow.  Take over-the-counter or prescription medicines.  Eat foods that are high in fiber, such as beans, whole grains, and fresh fruits and vegetables.  Limit foods that are high in fat and processed sugars, such as fried or sweet foods. Managing pain      If directed, put ice on the affected area. ? Put ice in a plastic bag. ? Place a towel between your skin and the bag. ? Leave the ice on for 20 minutes,  2-3 times a day.  If directed, apply heat to the affected area. Use the heat source that your health care provider recommends, such as a moist heat pack or a heating pad. ? Place a towel between your skin and the heat source. ? Leave the heat on for 20-30 minutes. ? Remove the heat if your skin turns bright red. This is especially important if you are unable to feel pain, heat, or cold. You may have a greater risk of getting burned. Activity   Return to your normal activities as told  by your health care provider. Ask your health care provider what activities are safe for you.  Avoid activities that make your symptoms worse.  Take brief periods of rest throughout the day. ? When you rest for longer periods, mix in some mild activity or stretching between periods of rest. This will help to prevent stiffness and pain. ? Avoid sitting for long periods of time without moving. Get up and move around at least one time each hour.  Exercise and stretch regularly, as told by your health care provider.  Do not lift anything that is heavier than 10 lb (4.5 kg) while you have symptoms of sciatica. When you do not have symptoms, you should still avoid heavy lifting, especially repetitive heavy lifting.  When you lift objects, always use proper lifting technique, which includes: ? Bending your knees. ? Keeping the load close to your body. ? Avoiding twisting. General instructions  Maintain a healthy weight. Excess weight puts extra stress on your back.  Wear supportive, comfortable shoes. Avoid wearing high heels.  Avoid sleeping on a mattress that is too soft or too hard. A mattress that is firm enough to support your back when you sleep may help to reduce your pain.  Keep all follow-up visits as told by your health care provider. This is important. Contact a health care provider if:  You have pain that: ? Wakes you up when you are sleeping. ? Gets worse when you lie down. ? Is worse than you have experienced in the past. ? Lasts longer than 4 weeks.  You have an unexplained weight loss. Get help right away if:  You are not able to control when you urinate or have bowel movements (incontinence).  You have: ? Weakness in your lower back, pelvis, buttocks, or legs that gets worse. ? Redness or swelling of your back. ? A burning sensation when you urinate. Summary  Sciatica is pain, numbness, weakness, or tingling along the path of the sciatic nerve.  This condition  is caused by pressure on the sciatic nerve or pinching of the nerve.  Sciatica can cause pain, numbness, or tingling in the lower back, legs, hips, and buttocks.  Treatment often includes rest, exercise, medicines, and applying ice or heat. This information is not intended to replace advice given to you by your health care provider. Make sure you discuss any questions you have with your health care provider. Document Revised: 06/20/2018 Document Reviewed: 06/20/2018 Elsevier Patient Education  2020 [employer].  Sciatica Rehab Ask your health care provider which exercises are safe for you. Do exercises exactly as told by your health care provider and adjust them as directed. It is normal to feel mild stretching, pulling, tightness, or discomfort as you do these exercises. Stop right away if you feel sudden pain or your pain gets worse. Do not begin these exercises until told by your health care provider. Stretching and range-of-motion exercises These exercises warm  up your muscles and joints and improve the movement and flexibility of your hips and back. These exercises also help to relieve pain, numbness, and tingling. Sciatic nerve glide 1. Sit in a chair with your head facing down toward your chest. Place your hands behind your back. Let your shoulders slump forward. 2. Slowly straighten one of your legs while you tilt your head back as if you are looking toward the ceiling. Only straighten your leg as far as you can without making your symptoms worse. 3. Hold this position for __________ seconds. 4. Slowly return to the starting position. 5. Repeat with your other leg. Repeat __________ times. Complete this exercise __________ times a day. Knee to chest with hip adduction and internal rotation  1. Lie on your back on a firm surface with both legs straight. 2. Bend one of your knees and move it up toward your chest until you feel a gentle stretch in your lower back and buttock. Then, move  your knee toward the shoulder that is on the opposite side from your leg. This is hip adduction and internal rotation. ? Hold your leg in this position by holding on to the front of your knee. 3. Hold this position for __________ seconds. 4. Slowly return to the starting position. 5. Repeat with your other leg. Repeat __________ times. Complete this exercise __________ times a day. Prone extension on elbows  1. Lie on your abdomen on a firm surface. A bed may be too soft for this exercise. 2. Prop yourself up on your elbows. 3. Use your arms to help lift your chest up until you feel a gentle stretch in your abdomen and your lower back. ? This will place some of your body weight on your elbows. If this is uncomfortable, try stacking pillows under your chest. ? Your hips should stay down, against the surface that you are lying on. Keep your hip and back muscles relaxed. 4. Hold this position for __________ seconds. 5. Slowly relax your upper body and return to the starting position. Repeat __________ times. Complete this exercise __________ times a day. Strengthening exercises These exercises build strength and endurance in your back. Endurance is the ability to use your muscles for a long time, even after they get tired. Pelvic tilt This exercise strengthens the muscles that lie deep in the abdomen. 1. Lie on your back on a firm surface. Bend your knees and keep your feet flat on the floor. 2. Tense your abdominal muscles. Tip your pelvis up toward the ceiling and flatten your lower back into the floor. ? To help with this exercise, you may place a small towel under your lower back and try to push your back into the towel. 3. Hold this position for __________ seconds. 4. Let your muscles relax completely before you repeat this exercise. Repeat __________ times. Complete this exercise __________ times a day. Alternating arm and leg raises  1. Get on your hands and knees on a firm surface.  If you are on a hard floor, you may want to use padding, such as an exercise mat, to cushion your knees. 2. Line up your arms and legs. Your hands should be directly below your shoulders, and your knees should be directly below your hips. 3. Lift your left leg behind you. At the same time, raise your right arm and straighten it in front of you. ? Do not lift your leg higher than your hip. ? Do not lift your arm higher than your  shoulder. ? Keep your abdominal and back muscles tight. ? Keep your hips facing the ground. ? Do not arch your back. ? Keep your balance carefully, and do not hold your breath. 4. Hold this position for __________ seconds. 5. Slowly return to the starting position. 6. Repeat with your right leg and your left arm. Repeat __________ times. Complete this exercise __________ times a day. Posture and body mechanics Good posture and healthy body mechanics can help to relieve stress in your body's tissues and joints. Body mechanics refers to the movements and positions of your body while you do your daily activities. Posture is part of body mechanics. Good posture means:  Your spine is in its natural S-curve position (neutral).  Your shoulders are pulled back slightly.  Your head is not tipped forward. Follow these guidelines to improve your posture and body mechanics in your everyday activities. Standing   When standing, keep your spine neutral and your feet about hip width apart. Keep a slight bend in your knees. Your ears, shoulders, and hips should line up.  When you do a task in which you stand in one place for a long time, place one foot up on a stable object that is 2-4 inches (5-10 cm) high, such as a footstool. This helps keep your spine neutral. Sitting   When sitting, keep your spine neutral and keep your feet flat on the floor. Use a footrest, if necessary, and keep your thighs parallel to the floor. Avoid rounding your shoulders, and avoid tilting your  head forward.  When working at a desk or a computer, keep your desk at a height where your hands are slightly lower than your elbows. Slide your chair under your desk so you are close enough to maintain good posture.  When working at a computer, place your monitor at a height where you are looking straight ahead and you do not have to tilt your head forward or downward to look at the screen. Resting  When lying down and resting, avoid positions that are most painful for you.  If you have pain with activities such as sitting, bending, stooping, or squatting, lie in a position in which your body does not bend very much. For example, avoid curling up on your side with your arms and knees near your chest (fetal position).  If you have pain with activities such as standing for a long time or reaching with your arms, lie with your spine in a neutral position and bend your knees slightly. Try the following positions: ? Lying on your side with a pillow between your knees. ? Lying on your back with a pillow under your knees. Lifting   When lifting objects, keep your feet at least shoulder width apart and tighten your abdominal muscles.  Bend your knees and hips and keep your spine neutral. It is important to lift using the strength of your legs, not your back. Do not lock your knees straight out.  Always ask for help to lift heavy or awkward objects. This information is not intended to replace advice given to you by your health care provider. Make sure you discuss any questions you have with your health care provider. Document Revised: 09/23/2018 Document Reviewed: 06/23/2018 Elsevier Patient Education  2020 [employer]  Acute Knee Pain, Adult Acute knee pain is sudden and may be caused by damage, swelling, or irritation of the muscles and tissues that support your knee. The injury may result from:  A fall.  An  injury to your knee from twisting motions.  A hit to the  knee.  Infection. Acute knee pain may go away on its own with time and rest. If it does not, your health care provider may order tests to find the cause of the pain. These may include:  Imaging tests, such as an X-ray, MRI, or ultrasound.  Joint aspiration. In this test, fluid is removed from the knee.  Arthroscopy. In this test, a lighted tube is inserted into the knee and an image is projected onto a TV screen.  Biopsy. In this test, a sample of tissue is removed from the body and studied under a microscope. Follow these instructions at home: Pay attention to any changes in your symptoms. Take these actions to relieve your pain. If you have a knee sleeve or brace:   Wear the sleeve or brace as told by your health care provider. Remove it only as told by your health care provider.  Loosen the sleeve or brace if your toes tingle, become numb, or turn cold and blue.  Keep the sleeve or brace clean.  If the sleeve or brace is not waterproof: ? Do not let it get wet. ? Cover it with a watertight covering when you take a bath or shower. Activity  Rest your knee.  Do not do things that cause pain or make pain worse.  Avoid high-impact activities or exercises, such as running, jumping rope, or doing jumping jacks.  Work with a physical therapist to make a safe exercise program, as recommended by your health care provider. Do exercises as told by your physical therapist. Managing pain, stiffness, and swelling   If directed, put ice on the knee: ? Put ice in a plastic bag. ? Place a towel between your skin and the bag. ? Leave the ice on for 20 minutes, 2-3 times a day.  If directed, use an elastic bandage to put pressure (compression) on your injured knee. This may control swelling, give support, and help with discomfort. General instructions  Take over-the-counter and prescription medicines only as told by your health care provider.  Raise (elevate) your knee above the level  of your heart when you are sitting or lying down.  Sleep with a pillow under your knee.  Do not use any products that contain nicotine or tobacco, such as cigarettes, e-cigarettes, and chewing tobacco. These can delay healing. If you need help quitting, ask your health care provider.  If you are overweight, work with your health care provider and a dietitian to set a weight-loss goal that is healthy and reasonable for you. Extra weight can put pressure on your knee.  Keep all follow-up visits as told by your health care provider. This is important. Contact a health care provider if:  Your knee pain continues, changes, or gets worse.  You have a fever along with knee pain.  Your knee feels warm to the touch.  Your knee buckles or locks up. Get help right away if:  Your knee swells, and the swelling becomes worse.  You cannot move your knee.  You have severe pain in your knee. Summary  Acute knee pain can be caused by a fall, an injury, an infection, or damage, swelling, or irritation of the tissues that support your knee.  Your health care provider may perform tests to find out the cause of the pain.  Pay attention to any changes in your symptoms. Relieve your pain with rest, medicines, light activity, and use of  ice.  Get help if your pain continues or becomes worse, your knee swells, or you cannot move your knee. This information is not intended to replace advice given to you by your health care provider. Make sure you discuss any questions you have with your health care provider. Document Revised: 11/11/2017 Document Reviewed: 11/11/2017 Elsevier Patient Education  2020 [employer]

## 2019-09-24 NOTE — Progress Notes (Signed)
Subjective:    Patient ID: Alexis Vega, female    DOB: 1985/08/20, 34 y.o.   MRN: [id number]  No chief complaint on file.   HPI Patient was seen today for acute concern.  Pt notes waking up with R sided low back pain with radiation down posterior R leg x 1 wk.  Pt does not recall injury or heavy lifting.  Endorses tossing and turning at night.  Tried advil for the pain.  Also notes R knee make a cracking noise and causes mild pain with walking up stairs.    Past Medical History:  Diagnosis Date  . Asthma   . Morbid obesity (HCC) 06/24/2011  . Seizures (HCC)    last siezure 34yo    Allergies  Allergen Reactions  . Shrimp [Shellfish Allergy] Itching and Swelling  . Strawberry Extract Hives    ROS General: Denies fever, chills, night sweats, changes in weight, changes in appetite HEENT: Denies headaches, ear pain, changes in vision, rhinorrhea, sore throat CV: Denies CP, palpitations, SOB, orthopnea Pulm: Denies SOB, cough, wheezing GI: Denies abdominal pain, nausea, vomiting, diarrhea, constipation GU: Denies dysuria, hematuria, frequency, vaginal discharge Msk: Denies muscle cramps  +R knee pain and cracking, low back pain Neuro: Denies weakness, numbness, tingling Skin: Denies rashes, bruising Psych: Denies depression, anxiety, hallucinations      Objective:    Blood pressure (!) 128/96, pulse 96, temperature 97.9 F (36.6 C), temperature source Temporal, weight (!) 354 lb (160.6 kg), SpO2 98 %.   Gen. Pleasant, well-nourished, in no distress, normal affect  HEENT: [place]/AT, face symmetric, no scleral icterus, PERRLA, EOMI, nares patent without drainage Cardiovascular: RRR, no peripheral edema Musculoskeletal: No TTP of cervical, thoracic, or lumbar spine.  TTP of R sciatic nerve.  Mild crepitus right knee.  No TTP of joint line b/l.  No effusion bilaterally.  No deformities, no cyanosis or clubbing, normal tone Neuro:  A&Ox3, CN II-XII intact, normal gait Skin:   Warm, no lesions/ rash  Wt Readings from Last 3 Encounters:  09/21/19 (!) 354 lb (160.6 kg)  08/24/19 (!) 355 lb (161 kg)  10/24/12 290 lb (131.5 kg)    Lab Results  Component Value Date   WBC 9.0 06/24/2011   HGB 13.0 06/24/2011   HCT 38.5 06/24/2011   PLT 210.0 06/24/2011   GLUCOSE 91 06/24/2011   CHOL 179 06/24/2011   TRIG 101.0 06/24/2011   HDL 49.10 06/24/2011   LDLCALC 110 (H) 06/24/2011   ALT 23 06/24/2011   AST 23 06/24/2011   NA 141 06/24/2011   K 4.4 06/24/2011   CL 106 06/24/2011   CREATININE 0.9 06/24/2011   BUN 15 06/24/2011   CO2 29 06/24/2011   TSH 1.13 08/24/2019   HGBA1C 5.8 08/24/2019    Assessment/Plan:  Acute low back pain with right-sided sciatica, unspecified back pain laterality  -Discussed supportive care -Given exercises -Given handout - Plan: meloxicam (MOBIC) 15 MG tablet, cyclobenzaprine (FLEXERIL) 5 MG tablet  Acute pain of right knee -Discussed possible causes including arthritis -Supportive care -Discussed importance of weight loss -For continued worsening symptoms consider imaging - Plan: meloxicam (MOBIC) 15 MG tablet  F/u as needed for continued symptoms  Abbe [place], MD

## 2019-09-25 ENCOUNTER — Other Ambulatory Visit: Payer: Self-pay

## 2019-09-25 ENCOUNTER — Ambulatory Visit (HOSPITAL_COMMUNITY)
Admission: EM | Admit: 2019-09-25 | Discharge: 2019-09-25 | Disposition: A | Discharge status: Home / Self Care | Payer: 59 | Attending: Family Medicine | Admitting: Family Medicine

## 2019-09-25 ENCOUNTER — Encounter (HOSPITAL_COMMUNITY): Payer: Self-pay | Admitting: Emergency Medicine

## 2019-09-25 DIAGNOSIS — G5701 Lesion of sciatic nerve, right lower limb: Secondary | ICD-10-CM | POA: Diagnosis not present

## 2019-09-25 MED ORDER — PREDNISONE 5 MG PO TABS: ORAL_TABLET | ORAL | 0 refills | Status: DC

## 2019-09-25 NOTE — ED Provider Notes (Signed)
Alexis Vega    CSN: [id number] Arrival date & time: 09/25/19  7341      History   Chief Complaint Chief Complaint  Patient presents with  . Leg Pain    HPI Alexis Vega is a 34 y.o. female.   She is presenting with right leg pain.  She has ongoing low back pain that she saw her primary care doctor for.  This pain in the leg is on the lateral aspect as well as some altered sensation of the right foot.  Has been working for home.  Symptoms are worse when she sits.  They resolve when she stands.  No saddle anesthesia or urinary incontinence.  HPI  Past Medical History:  Diagnosis Date  . Asthma   . Morbid obesity ([place]) 06/24/2011  . Seizures ([place])    last siezure 34yo    Patient Active Problem List   Diagnosis Date Noted  . History of asthma 08/28/2019  . History of seizures 08/28/2019  . Skin tags, multiple acquired 08/28/2019  . Goiter 08/28/2019  . Acanthosis nigricans 08/28/2019  . Obesity 06/10/2017  . Body mass index (BMI) 40.0-44.9, adult 06/10/2017  . Candidiasis of skin 06/10/2017  . Rash 04/11/2012  . Morbid obesity ([place]) 06/24/2011  . Seizures ([place])   . Preventative health care 06/20/2011    History reviewed. No pertinent surgical history.  OB History   No obstetric history on file.      Home Medications    Prior to Admission medications   Medication Sig Start Date End Date Taking? Authorizing Provider  cyclobenzaprine (FLEXERIL) 5 MG tablet Take 1 tablet (5 mg total) by mouth 3 (three) times daily as needed for muscle spasms. 09/21/19   Billie Ruddy, MD  ibuprofen (ADVIL) 200 MG tablet Advil 200 mg tablet  Take 1 tablet every 6 hours by oral route.    [provider]  meloxicam (MOBIC) 15 MG tablet Take 1 tablet (15 mg total) by mouth daily. 09/21/19   Billie Ruddy, MD  predniSONE (DELTASONE) 5 MG tablet Take 6 pills for first day, 5 pills second day, 4 pills third day, 3 pills fourth day, 2 pills the fifth day,  and 1 pill sixth day. 09/25/19   Rosemarie Ax, MD    Family History Family History  Problem Relation Age of Onset  . Seizures Father   . Hypertension Mother   . Cancer Mother   . Hypertension Sister     Social History Social History   Tobacco Use  . Smoking status: Never Smoker  . Smokeless tobacco: Never Used  Substance Use Topics  . Alcohol use: Yes    Comment: social  . Drug use: No     Allergies   Shrimp [shellfish allergy] and Strawberry extract   Review of Systems Review of Systems See HPI  Physical Exam Triage Vital Signs ED Triage Vitals  Enc Vitals Group     BP 09/25/19 1928 (!) 159/95     Pulse Rate 09/25/19 1928 88     Resp 09/25/19 1928 20     Temp 09/25/19 1928 99.8 F (37.7 C)     Temp Source 09/25/19 1928 Oral     SpO2 09/25/19 1928 100 %     Weight --      Height --      Head Circumference --      Peak Flow --      Pain Score 09/25/19 1935 7  Pain Loc --      Pain Edu? --      Excl. in GC? --    No data found.  Updated Vital Signs BP (!) 159/95 (BP Location: Left Arm)   Pulse 88   Temp 99.8 F (37.7 C) (Oral)   Resp 20   SpO2 100%   Visual Acuity Right Eye Distance:   Left Eye Distance:   Bilateral Distance:    Right Eye Near:   Left Eye Near:    Bilateral Near:     Physical Exam Gen: NAD, alert, cooperative with exam, well-appearing ENT: normal lips, normal nasal mucosa,  Skin: no rashes, no areas of induration  Neuro: normal tone, normal sensation to touch Psych:  normal insight, alert and oriented MSK:  Back: Normal internal and external rotation of the right hip. Normal strength resistance with hip flexion. Negative straight leg raise. Normal resistance to plantarflexion and dorsiflexion. Neurovascular intact   UC Treatments / Results  Labs (all labs ordered are listed, but only abnormal results are displayed) Labs Reviewed - No data to display  EKG   Radiology No results  found.  Procedures Procedures (including critical care time)  Medications Ordered in UC Medications - No data to display  Initial Impression / Assessment and Plan / UC Course  I have reviewed the triage vital signs and the nursing notes.  Pertinent labs & imaging results that were available during my care of the patient were reviewed by me and considered in my medical decision making (see chart for details).     Ms. Alexis Vega is a 34 year old female is presenting with symptoms suggestive of piriformis syndrome.  Counseled on home exercise therapy and supportive care.  Provided prednisone and counseled on the muscle relaxer use.  Given indications to follow-up and return.   Final Clinical Impressions(s) / UC Diagnoses   Final diagnoses:  Piriformis syndrome of right side     Discharge Instructions     Please try the exercises  Please do not take the mobic with the prednisone. You can start taking it again once the prednisone is finished  Please try heat  Please follow up if your symptoms fail to improve.     ED Prescriptions    Medication Sig Dispense Auth. Provider   predniSONE (DELTASONE) 5 MG tablet Take 6 pills for first day, 5 pills second day, 4 pills third day, 3 pills fourth day, 2 pills the fifth day, and 1 pill sixth day. 21 tablet Myra Rude, MD     PDMP not reviewed this encounter.   Myra Rude, MD 09/25/19 2047

## 2019-09-25 NOTE — Discharge Instructions (Signed)
Please try the exercises  Please do not take the mobic with the prednisone. You can start taking it again once the prednisone is finished  Please try heat  Please follow up if your symptoms fail to improve.

## 2019-09-25 NOTE — ED Triage Notes (Signed)
Pt sts was recently treated for sciatica on the right side; pt sts taking meds but now having what feels like muscle spasms in that leg that is causing her foot to have numbness at times

## 2019-09-26 ENCOUNTER — Emergency Department (HOSPITAL_COMMUNITY)
Admission: EM | Admit: 2019-09-26 | Discharge: 2019-09-26 | Disposition: A | Discharge status: Home / Self Care | Payer: 59 | Attending: Emergency Medicine | Admitting: Emergency Medicine

## 2019-09-26 ENCOUNTER — Other Ambulatory Visit: Payer: Self-pay

## 2019-09-26 ENCOUNTER — Emergency Department (HOSPITAL_COMMUNITY): Payer: 59

## 2019-09-26 DIAGNOSIS — J45909 Unspecified asthma, uncomplicated: Secondary | ICD-10-CM | POA: Insufficient documentation

## 2019-09-26 DIAGNOSIS — M5417 Radiculopathy, lumbosacral region: Secondary | ICD-10-CM | POA: Insufficient documentation

## 2019-09-26 DIAGNOSIS — M62838 Other muscle spasm: Secondary | ICD-10-CM | POA: Insufficient documentation

## 2019-09-26 DIAGNOSIS — R202 Paresthesia of skin: Secondary | ICD-10-CM | POA: Diagnosis present

## 2019-09-26 LAB — POC URINE PREG, ED: Preg Test, Ur: NEGATIVE

## 2019-09-26 MED ORDER — LIDOCAINE 4 % EX PTCH: 1.0000 | MEDICATED_PATCH | Freq: Two times a day (BID) | CUTANEOUS | 0 refills | Status: DC

## 2019-09-26 MED ORDER — DIAZEPAM 5 MG PO TABS: 5.0000 mg | ORAL_TABLET | Freq: Two times a day (BID) | ORAL | 0 refills | Status: DC

## 2019-09-26 MED ORDER — OXYCODONE-ACETAMINOPHEN 5-325 MG PO TABS
1.0000 | ORAL_TABLET | Freq: Once | ORAL | Status: AC
  Administered 2019-09-26: 1 via ORAL
  Filled 2019-09-26: qty 1

## 2019-09-26 MED ORDER — DIAZEPAM 5 MG PO TABS
5.0000 mg | ORAL_TABLET | Freq: Once | ORAL | Status: AC
  Administered 2019-09-26: 16:00:00 5 mg via ORAL
  Filled 2019-09-26: qty 1

## 2019-09-26 MED ORDER — ACETAMINOPHEN ER 650 MG PO TBCR: 650.0000 mg | EXTENDED_RELEASE_TABLET | Freq: Three times a day (TID) | ORAL | 0 refills | Status: AC | PRN

## 2019-09-26 NOTE — ED Provider Notes (Signed)
[facility]-EMERGENCY DEPT Provider Note   CSN: [id number] Arrival date & time: 09/26/19  1428     History Chief Complaint  Patient presents with  . Spasms    Alexis Vega is a 34 y.o. female.  HPI     34 year old female comes in a chief complaint of spasms. Patient has history of elevated BMI, seizures.  She started having back pain few days back.  She went to her PCP and was started on Mobic and muscle relaxant with suspected sciatica.  Patient has been taking her medications with her symptoms have progressed.  She started having numbness in her right foot yesterday and went to urgent care, started on prednisone.  She reports persistent discomfort, prompting her to come to the ER.  Her spasms in her thigh are the most uncomfortable. Pt has no associated numbness, weakness, urinary incontinence, urinary retention, bowel incontinence, pins and needle sensation in the perineal area.    Past Medical History:  Diagnosis Date  . Asthma   . Morbid obesity ([facility]) 06/24/2011  . Seizures ([facility])    last siezure 34yo    Patient Active Problem List   Diagnosis Date Noted  . History of asthma 08/28/2019  . History of seizures 08/28/2019  . Skin tags, multiple acquired 08/28/2019  . Goiter 08/28/2019  . Acanthosis nigricans 08/28/2019  . Obesity 06/10/2017  . Body mass index (BMI) 40.0-44.9, adult 06/10/2017  . Candidiasis of skin 06/10/2017  . Rash 04/11/2012  . Morbid obesity ([facility]) 06/24/2011  . Seizures ([facility])   . Preventative health care 06/20/2011    No past surgical history on file.   OB History   No obstetric history on file.     Family History  Problem Relation Age of Onset  . Seizures Father   . Hypertension Mother   . Cancer Mother   . Hypertension Sister     Social History   Tobacco Use  . Smoking status: Never Smoker  . Smokeless tobacco: Never Used  Substance Use Topics  . Alcohol use: Yes    Comment: social  . Drug  use: No    Home Medications Prior to Admission medications   Medication Sig Start Date End Date Taking? Authorizing Provider  cyclobenzaprine (FLEXERIL) 5 MG tablet Take 1 tablet (5 mg total) by mouth 3 (three) times daily as needed for muscle spasms. 09/21/19   Deeann Saint, MD  ibuprofen (ADVIL) 200 MG tablet Advil 200 mg tablet  Take 1 tablet every 6 hours by oral route.    [provider]  meloxicam (MOBIC) 15 MG tablet Take 1 tablet (15 mg total) by mouth daily. 09/21/19   Deeann Saint, MD  predniSONE (DELTASONE) 5 MG tablet Take 6 pills for first day, 5 pills second day, 4 pills third day, 3 pills fourth day, 2 pills the fifth day, and 1 pill sixth day. 09/25/19   Myra Rude, MD    Allergies    Shrimp [shellfish allergy] and Strawberry extract  Review of Systems   Review of Systems  Constitutional: Positive for activity change.  Musculoskeletal: Positive for back pain.  Allergic/Immunologic: Negative for immunocompromised state.  Neurological: Positive for numbness.  Hematological: Does not bruise/bleed easily.    Physical Exam Updated Vital Signs BP (!) 160/78 (BP Location: Right Arm)   Pulse 89   Temp 98.9 F (37.2 C) (Oral)   Resp 19   SpO2 100%   Physical Exam Vitals and nursing note reviewed.  Constitutional:      Appearance: She is well-developed.  HENT:     Head: Normocephalic and atraumatic.  Cardiovascular:     Rate and Rhythm: Normal rate.  Pulmonary:     Effort: Pulmonary effort is normal.  Abdominal:     General: Bowel sounds are normal.  Musculoskeletal:     Cervical back: Normal range of motion and neck supple.     Comments:  No step offs, no erythema. Pt has 1+ patellar reflex bilaterally. Able to discriminate between sharp and dull. Positive passive straight leg test on the right side.   Skin:    General: Skin is warm and dry.  Neurological:     Mental Status: She is alert and oriented to person, place, and time.      ED Results / Procedures / Treatments   Labs (all labs ordered are listed, but only abnormal results are displayed) Labs Reviewed - No data to display  EKG None  Radiology No results found.  Procedures Procedures (including critical care time)  Medications Ordered in ED Medications  diazepam (VALIUM) tablet 5 mg (has no administration in time range)  oxyCODONE-acetaminophen (PERCOCET/ROXICET) 5-325 MG per tablet 1 tablet (has no administration in time range)    ED Course  I have reviewed the triage vital signs and the nursing notes.  Pertinent labs & imaging results that were available during my care of the patient were reviewed by me and considered in my medical decision making (see chart for details).    MDM Rules/Calculators/A&P                     34 year old comes in a chief complaint of back pain.  She has pain radiating down to her foot, with foot numbness and spasms over her right thigh.  Her symptoms have progressed.  No red flags suggesting cord compression or cauda equina.  She does not have any cancer history.   We will get x-ray of her spine as this is her third visit to the ER.  I suspect the x-rays will be fine.  She has a PCP appointment tomorrow.  I think she will benefit with PT or perhaps advanced imaging if needed.  No need for emergent MRI.  We will give her Valium instead of Flexeril she is on.   Final Clinical Impression(s) / ED Diagnoses Final diagnoses:  Muscle spasm of right lower extremity  Lumbosacral radiculopathy    Rx / DC Orders ED Discharge Orders    None       Varney Biles, MD 09/26/19 1552

## 2019-09-26 NOTE — ED Triage Notes (Signed)
Right leg spasms from right hip to right thigh. Recently diagnosed with sciatica. +back pain. Has been prescribed medication with minimal relief. Prescribed Flexeril, Meloxicam, and Prednisone.

## 2019-09-26 NOTE — Discharge Instructions (Signed)
We signed the ER for back pain.  Appears that your pain is secondary to impingement syndrome.  Please take the medication as prescribed.  Apply lidocaine patch to your thigh to see if it relieves your spasms.  Follow-up with your primary care doctor tomorrow as planned.

## 2019-09-26 NOTE — ED Notes (Signed)
Pt ambulated w/o assist over 30 feet.  Pt maintained a steady gait.  Pt complained of minor numbness in rt foot and some  muscle spasm when ambulating.

## 2019-09-27 ENCOUNTER — Encounter: Payer: Self-pay | Admitting: Family Medicine

## 2019-09-27 ENCOUNTER — Ambulatory Visit (INDEPENDENT_AMBULATORY_CARE_PROVIDER_SITE_OTHER): Payer: 59 | Admitting: Family Medicine

## 2019-09-27 VITALS — BP 118/80 | HR 78 | Temp 97.9°F | Wt 352.0 lb

## 2019-09-27 DIAGNOSIS — M5417 Radiculopathy, lumbosacral region: Secondary | ICD-10-CM

## 2019-09-27 NOTE — Patient Instructions (Signed)
Lumbosacral Radiculopathy Lumbosacral radiculopathy is a condition that involves the spinal nerves and nerve roots in the low back and bottom of the spine. The condition develops when these nerves and nerve roots move out of place or become inflamed and cause symptoms. What are the causes? This condition may be caused by:  Pressure from a disk that bulges out of place (herniated disk). A disk is a plate of soft cartilage that separates bones in the spine.  Disk changes that occur with age (disk degeneration).  A narrowing of the bones of the lower back (spinal stenosis).  A tumor.  An infection.  An injury that places sudden pressure on the disks that cushion the bones of your lower spine. What increases the risk? You are more likely to develop this condition if:  You are a female who is 30-50 years old.  You are a female who is 50-60 years old.  You use improper technique when lifting things.  You are overweight or live a sedentary lifestyle.  Your work requires frequent lifting.  You smoke.  You do repetitive activities that strain the spine. What are the signs or symptoms? Symptoms of this condition include:  Pain that goes down from your back into your legs (sciatica), usually on one side of the body. This is the most common symptom. The pain may be worse with sitting, coughing, or sneezing.  Pain and numbness in your legs.  Muscle weakness.  Tingling.  Loss of bladder control or bowel control. How is this diagnosed? This condition may be diagnosed based on:  Your symptoms and medical history.  A physical exam. If the pain is lasting, you may have tests, such as:  MRI scan.  X-ray.  CT scan.  A type of X-ray used to examine the spinal canal after injecting a dye into your spine (myelogram).  A test to measure how electrical impulses move through a nerve (nerve conduction study). How is this treated? Treatment may depend on the cause of the condition and  may include:  Working with a physical therapist.  Taking pain medicine.  Applying heat and ice to affected areas.  Doing stretches to improve flexibility.  Doing exercises to strengthen back muscles.  Having chiropractic spinal manipulation.  Using transcutaneous electrical nerve stimulation (TENS) therapy.  Getting a steroid injection in the spine. In some cases, no treatment is needed. If the condition is long-lasting (chronic), or if symptoms are severe, treatment may involve surgery or lifestyle changes, such as following a weight-loss plan. Follow these instructions at home: Activity  Avoid bending and other activities that make the problem worse.  Maintain a proper position when standing or sitting: ? When standing, keep your upper back and neck straight, with your shoulders pulled back. Avoid slouching. ? When sitting, keep your back straight and relax your shoulders. Do not round your shoulders or pull them backward.  Do not sit or stand in one place for long periods of time.  Take brief periods of rest throughout the day. This will reduce your pain. It is usually better to rest by lying down or standing, not sitting.  When you are resting for longer periods, mix in some mild activity or stretching between periods of rest. This will help to prevent stiffness and pain.  Get regular exercise. Ask your health care provider what activities are safe for you. If you were shown how to do any exercises or stretches, do them as directed by your health care provider.  Do   not lift anything that is heavier than 10 lb (4.5 kg) or the limit that you are told by your health care provider. Always use proper lifting technique, which includes: ? Bending your knees. ? Keeping the load close to your body. ? Avoiding twisting. Managing pain  If directed, put ice on the affected area: ? Put ice in a plastic bag. ? Place a towel between your skin and the bag. ? Leave the ice on for 20  minutes, 2-3 times a day.  If directed, apply heat to the affected area as often as told by your health care provider. Use the heat source that your health care provider recommends, such as a moist heat pack or a heating pad. ? Place a towel between your skin and the heat source. ? Leave the heat on for 20-30 minutes. ? Remove the heat if your skin turns bright red. This is especially important if you are unable to feel pain, heat, or cold. You may have a greater risk of getting burned.  Take over-the-counter and prescription medicines only as told by your health care provider. General instructions  Sleep on a firm mattress in a comfortable position. Try lying on your side with your knees slightly bent. If you lie on your back, put a pillow under your knees.  Do not drive or use heavy machinery while taking prescription pain medicine.  If your health care provider prescribed a diet or exercise program, follow it as directed.  Keep all follow-up visits as told by your health care provider. This is important. Contact a health care provider if:  Your pain does not improve over time, even when taking pain medicines. Get help right away if:  You develop severe pain.  Your pain suddenly gets worse.  You develop increasing weakness in your legs.  You lose the ability to control your bladder or bowel.  You have difficulty walking or balancing.  You have a fever. Summary  Lumbosacral radiculopathy is a condition that occurs when the spinal nerves and nerve roots in the lower part of the spine move out of place or become inflamed and cause symptoms.  Symptoms include pain, numbness, and tingling that go down from your back into your legs (sciatica), muscle weakness, and loss of bladder control or bowel control.  If directed, apply ice or heat to the affected area as told by your health care provider.  Follow instructions about activity, rest, and proper lifting technique. This  information is not intended to replace advice given to you by your health care provider. Make sure you discuss any questions you have with your health care provider. Document Revised: 05/20/2017 Document Reviewed: 05/20/2017 Elsevier Patient Education  2020 [employer]  

## 2019-09-27 NOTE — Progress Notes (Signed)
Subjective:    Patient ID: Alexis Vega, female    DOB: [birth year], 34 y.o.   MRN: [id number]  No chief complaint on file.   HPI Patient was seen today for f/u on back and leg pain.  Antony Blackbird 4/8 for acute low back pain with R sided sciatica.  Started on Moboic and msk relaxer.  Pt states her back felt better but she developed spasms in the right leg.  States R leg locked up, foot felt numb, and she had pain with sitting.  Pt went to urgent care on Monday given steroid Dosepak.  Pt states the pain became 12/10 with laying down so she went to the ED on 4/13.  X-ray lumbar spine obtained.  Pt given Valium and lidocaine patches.  Pain improved with Valium.  Still having intermittent numbness in foot and occasional weakness in the right leg.  Back pain has resolved.  Pt is interested in physical therapy.  Denies loss of bowel or bladder, back injury, fever, chills.  Past Medical History:  Diagnosis Date  . Asthma   . Morbid obesity ([facility]) 06/24/2011  . Seizures ([facility])    last siezure 34yo    Allergies  Allergen Reactions  . Shrimp [Shellfish Allergy] Itching and Swelling  . Strawberry Extract Hives    ROS General: Denies fever, chills, night sweats, changes in weight, changes in appetite HEENT: Denies headaches, ear pain, changes in vision, rhinorrhea, sore throat CV: Denies CP, palpitations, SOB, orthopnea Pulm: Denies SOB, cough, wheezing GI: Denies abdominal pain, nausea, vomiting, diarrhea, constipation GU: Denies dysuria, hematuria, frequency, vaginal discharge Msk: Denies muscle cramps, joint pains  +back pain and muscle spasms-resolved, R leg pain Neuro: Denies weakness, tingling  +numbness in R foot Skin: Denies rashes, bruising Psych: Denies depression, anxiety, hallucinations      Objective:    Blood pressure 118/80, pulse 78, temperature 97.9 F (36.6 C), temperature source Temporal, weight (!) 352 lb (159.7 kg), SpO2 98 %.   Gen. Pleasant, well-nourished, obese,  in no distress, normal affect   Lungs: no accessory muscle use Cardiovascular: RRR, no peripheral edema Musculoskeletal: No TTP of cervical, thoracic, lumbar spine., sciatic nerve.  Strength in b/l LEs 5/5.  Slightly diminished R patellar and Achilles reflexes.  Normal ankle dorsiflexion bilaterally.  R great toe plantar flexion 4/5.  L great toe plantar flexion 5/5.  No deformities, no cyanosis or clubbing, normal tone Neuro:  A&Ox3, CN II-XII intact, normal gait Skin:  Warm, no lesions/ rash   Wt Readings from Last 3 Encounters:  09/27/19 (!) 352 lb (159.7 kg)  09/21/19 (!) 354 lb (160.6 kg)  08/24/19 (!) 355 lb (161 kg)    Lab Results  Component Value Date   WBC 9.0 06/24/2011   HGB 13.0 06/24/2011   HCT 38.5 06/24/2011   PLT 210.0 06/24/2011   GLUCOSE 91 06/24/2011   CHOL 179 06/24/2011   TRIG 101.0 06/24/2011   HDL 49.10 06/24/2011   LDLCALC 110 (H) 06/24/2011   ALT 23 06/24/2011   AST 23 06/24/2011   NA 141 06/24/2011   K 4.4 06/24/2011   CL 106 06/24/2011   CREATININE 0.9 06/24/2011   BUN 15 06/24/2011   CO2 29 06/24/2011   TSH 1.13 08/24/2019   HGBA1C 5.8 08/24/2019    Assessment/Plan:  Lumbosacral radiculopathy  -Lumbar xray with mild degenerative changes in L5-S1.  No acute bony -Continue current medication including Valium, lidocaine patches, prednisone, NSAIDs as needed -Continue supportive care including heat, stretching -Discussed advanced  imaging.  Wishes to wait at this time.  For continued or worsened symptoms will obtain MRI - Plan: Ambulatory referral to Physical Therapy  F/u prn  Grier Mitts, MD

## 2019-10-14 ENCOUNTER — Other Ambulatory Visit: Payer: Self-pay | Admitting: Family Medicine

## 2019-10-14 DIAGNOSIS — M25561 Pain in right knee: Secondary | ICD-10-CM

## 2019-10-14 DIAGNOSIS — M5441 Lumbago with sciatica, right side: Secondary | ICD-10-CM

## 2020-06-29 ENCOUNTER — Other Ambulatory Visit: Payer: No Typology Code available for payment source

## 2020-06-29 DIAGNOSIS — Z20822 Contact with and (suspected) exposure to covid-19: Secondary | ICD-10-CM

## 2020-07-02 LAB — NOVEL CORONAVIRUS, NAA: SARS-CoV-2, NAA: DETECTED — AB

## 2020-08-23 ENCOUNTER — Other Ambulatory Visit: Payer: Self-pay

## 2020-08-23 ENCOUNTER — Encounter: Payer: Self-pay | Admitting: Family Medicine

## 2020-08-23 ENCOUNTER — Ambulatory Visit (INDEPENDENT_AMBULATORY_CARE_PROVIDER_SITE_OTHER): Payer: No Typology Code available for payment source | Admitting: Family Medicine

## 2020-08-23 VITALS — BP 116/78 | HR 75 | Temp 99.1°F | Ht 67.25 in | Wt 350.0 lb

## 2020-08-23 DIAGNOSIS — F439 Reaction to severe stress, unspecified: Secondary | ICD-10-CM | POA: Diagnosis not present

## 2020-08-23 DIAGNOSIS — Z0000 Encounter for general adult medical examination without abnormal findings: Secondary | ICD-10-CM

## 2020-08-23 DIAGNOSIS — R5383 Other fatigue: Secondary | ICD-10-CM | POA: Diagnosis not present

## 2020-08-23 DIAGNOSIS — Z6841 Body Mass Index (BMI) 40.0 and over, adult: Secondary | ICD-10-CM | POA: Diagnosis not present

## 2020-08-23 LAB — COMPREHENSIVE METABOLIC PANEL
ALT: 14 U/L (ref 0–35)
AST: 17 U/L (ref 0–37)
Albumin: 3.8 g/dL (ref 3.5–5.2)
Alkaline Phosphatase: 62 U/L (ref 39–117)
BUN: 15 mg/dL (ref 6–23)
CO2: 29 mEq/L (ref 19–32)
Calcium: 9.5 mg/dL (ref 8.4–10.5)
Chloride: 103 mEq/L (ref 96–112)
Creatinine, Ser: 0.94 mg/dL (ref 0.40–1.20)
GFR: 78.09 mL/min (ref >60.00)
Glucose, Bld: 83 mg/dL (ref 70–99)
Potassium: 4.5 mEq/L (ref 3.5–5.1)
Sodium: 140 mEq/L (ref 135–145)
Total Bilirubin: 0.3 mg/dL (ref 0.2–1.2)
Total Protein: 6.9 g/dL (ref 6.0–8.3)

## 2020-08-23 LAB — HEMOGLOBIN A1C: Hgb A1c MFr Bld: 5.8 % (ref 4.6–6.5)

## 2020-08-23 LAB — CBC WITH DIFFERENTIAL/PLATELET
Basophils Absolute: 0.1 10*3/uL (ref 0.0–0.1)
Basophils Relative: 0.7 % (ref 0.0–3.0)
Eosinophils Absolute: 0.1 10*3/uL (ref 0.0–0.7)
Eosinophils Relative: 1.9 % (ref 0.0–5.0)
HCT: 37.9 % (ref 36.0–46.0)
Hemoglobin: 12.4 g/dL (ref 12.0–15.0)
Lymphocytes Relative: 31.8 % (ref 12.0–46.0)
Lymphs Abs: 2.5 10*3/uL (ref 0.7–4.0)
MCHC: 32.8 g/dL (ref 30.0–36.0)
MCV: 83.9 fl (ref 78.0–100.0)
Monocytes Absolute: 0.5 10*3/uL (ref 0.1–1.0)
Monocytes Relative: 6.6 % (ref 3.0–12.0)
Neutro Abs: 4.6 10*3/uL (ref 1.4–7.7)
Neutrophils Relative %: 59 % (ref 43.0–77.0)
Platelets: 218 10*3/uL (ref 150.0–400.0)
RBC: 4.51 Mil/uL (ref 3.87–5.11)
RDW: 14.2 % (ref 11.5–15.5)
WBC: 7.8 10*3/uL (ref 4.0–10.5)

## 2020-08-23 LAB — LIPID PANEL
Cholesterol: 146 mg/dL (ref 0–200)
HDL: 40.4 mg/dL (ref >39.00)
LDL Cholesterol: 84 mg/dL (ref 0–99)
NonHDL: 105.58
Total CHOL/HDL Ratio: 4
Triglycerides: 108 mg/dL (ref 0.0–149.0)
VLDL: 21.6 mg/dL (ref 0.0–40.0)

## 2020-08-23 LAB — VITAMIN B12: Vitamin B-12: 280 pg/mL (ref 211–911)

## 2020-08-23 LAB — VITAMIN D 25 HYDROXY (VIT D DEFICIENCY, FRACTURES): VITD: 11.66 ng/mL — ABNORMAL LOW (ref 30.00–100.00)

## 2020-08-23 LAB — T4, FREE: Free T4: 0.83 ng/dL (ref 0.60–1.60)

## 2020-08-23 LAB — TSH: TSH: 1.75 u[IU]/mL (ref 0.35–4.50)

## 2020-08-23 NOTE — Patient Instructions (Signed)
Preventive Care 21-37 Years Old, Female Preventive care refers to lifestyle choices and visits with your health care provider that can promote health and wellness. This includes:  A yearly physical exam. This is also called an annual wellness visit.  Regular dental and eye exams.  Immunizations.  Screening for certain conditions.  Healthy lifestyle choices, such as: ? Eating a healthy diet. ? Getting regular exercise. ? Not using drugs or products that contain nicotine and tobacco. ? Limiting alcohol use. What can I expect for my preventive care visit? Physical exam Your health care provider may check your:  Height and weight. These may be used to calculate your BMI (body mass index). BMI is a measurement that tells if you are at a healthy weight.  Heart rate and blood pressure.  Body temperature.  Skin for abnormal spots. Counseling Your health care provider may ask you questions about your:  Past medical problems.  Family's medical history.  Alcohol, tobacco, and drug use.  Emotional well-being.  Home life and relationship well-being.  Sexual activity.  Diet, exercise, and sleep habits.  Work and work environment.  Access to firearms.  Method of birth control.  Menstrual cycle.  Pregnancy history. What immunizations do I need? Vaccines are usually given at various ages, according to a schedule. Your health care provider will recommend vaccines for you based on your age, medical history, and lifestyle or other factors, such as travel or where you work.   What tests do I need? Blood tests  Lipid and cholesterol levels. These may be checked every 5 years starting at age 20.  Hepatitis C test.  Hepatitis B test. Screening  Diabetes screening. This is done by checking your blood sugar (glucose) after you have not eaten for a while (fasting).  STD (sexually transmitted disease) testing, if you are at risk.  BRCA-related cancer screening. This may be  done if you have a family history of breast, ovarian, tubal, or peritoneal cancers.  Pelvic exam and Pap test. This may be done every 3 years starting at age 21. Starting at age 30, this may be done every 5 years if you have a Pap test in combination with an HPV test. Talk with your health care provider about your test results, treatment options, and if necessary, the need for more tests.   Follow these instructions at home: Eating and drinking  Eat a healthy diet that includes fresh fruits and vegetables, whole grains, lean protein, and low-fat dairy products.  Take vitamin and mineral supplements as recommended by your health care provider.  Do not drink alcohol if: ? Your health care provider tells you not to drink. ? You are pregnant, may be pregnant, or are planning to become pregnant.  If you drink alcohol: ? Limit how much you have to 0-1 drink a day. ? Be aware of how much alcohol is in your drink. In the [place] one drink equals one 12 oz bottle of beer (355 mL), one 5 oz glass of wine (148 mL), or one 1 oz glass of hard liquor (44 mL).   Lifestyle  Take daily care of your teeth and gums. Brush your teeth every morning and night with fluoride toothpaste. Floss one time each day.  Stay active. Exercise for at least 30 minutes 5 or more days each week.  Do not use any products that contain nicotine or tobacco, such as cigarettes, e-cigarettes, and chewing tobacco. If you need help quitting, ask your health care provider.  Do not   use drugs.  If you are sexually active, practice safe sex. Use a condom or other form of protection to prevent STIs (sexually transmitted infections).  If you do not wish to become pregnant, use a form of birth control. If you plan to become pregnant, see your health care provider for a prepregnancy visit.  Find healthy ways to cope with stress, such as: ? Meditation, yoga, or listening to music. ? Journaling. ? Talking to a trusted  person. ? Spending time with friends and family. Safety  Always wear your seat belt while driving or riding in a vehicle.  Do not drive: ? If you have been drinking alcohol. Do not ride with someone who has been drinking. ? When you are tired or distracted. ? While texting.  Wear a helmet and other protective equipment during sports activities.  If you have firearms in your house, make sure you follow all gun safety procedures.  Seek help if you have been physically or sexually abused. What's next?  Go to your health care provider once a year for an annual wellness visit.  Ask your health care provider how often you should have your eyes and teeth checked.  Stay up to date on all vaccines. This information is not intended to replace advice given to you by your health care provider. Make sure you discuss any questions you have with your health care provider. Document Revised: 01/28/2020 Document Reviewed: 02/10/2018 Elsevier Patient Education  2021 [place] [employer] (9th ed., pp. [phone]). [place], [place]: [employer].">  Stress, Adult Stress is a normal reaction to life events. Stress is what you feel when life demands more than you are used to, or more than you think you can handle. Some stress can be useful, such as studying for a test or meeting a deadline at work. Stress that occurs too often or for too long can cause problems. It can affect your emotional health and interfere with relationships and normal daily activities. Too much stress can weaken your body's defense system (immune system) and increase your risk for physical illness. If you already have a medical problem, stress can make it worse. What are the causes? All sorts of life events can cause stress. An event that causes stress for one person may not be stressful for another person. Major life events, whether positive or negative, commonly cause stress. Examples include:  Losing a job or  starting a new job.  Losing a loved one.  Moving to a new town or home.  Getting married or divorced.  Having a baby.  Getting injured or sick. Less obvious life events can also cause stress, especially if they occur day after day or in combination with each other. Examples include:  Working long hours.  Driving in traffic.  Caring for children.  Being in debt.  Being in a difficult relationship. What are the signs or symptoms? Stress can cause emotional symptoms, including:  Anxiety. This is feeling worried, afraid, on edge, overwhelmed, or out of control.  Anger, including irritation or impatience.  Depression. This is feeling sad, down, helpless, or guilty.  Trouble focusing, remembering, or making decisions. Stress can cause physical symptoms, including:  Aches and pains. These may affect your head, neck, back, stomach, or other areas of your body.  Tight muscles or a clenched jaw.  Low energy.  Trouble sleeping. Stress can cause unhealthy behaviors, including:  Eating to feel better (overeating) or skipping meals.  Working too much or putting off  tasks.  Smoking, drinking alcohol, or using drugs to feel better. How is this diagnosed? Stress is diagnosed through an assessment by your health care provider. He or she may diagnose this condition based on:  Your symptoms and any stressful life events.  Your medical history.  Tests to rule out other causes of your symptoms. Depending on your condition, your health care provider may refer you to a specialist for further evaluation. How is this treated? Stress management techniques are the recommended treatment for stress. Medicine is not typically recommended for the treatment of stress. Techniques to reduce your reaction to stressful life events include:  Stress identification. Monitor yourself for symptoms of stress and identify what causes stress for you. These skills may help you to avoid or prepare for  stressful events.  Time management. Set your priorities, keep a calendar of events, and learn to say no. Taking these actions can help you avoid making too many commitments. Techniques for coping with stress include:  Rethinking the problem. Try to think realistically about stressful events rather than ignoring them or overreacting. Try to find the positives in a stressful situation rather than focusing on the negatives.  Exercise. Physical exercise can release both physical and emotional tension. The key is to find a form of exercise that you enjoy and do it regularly.  Relaxation techniques. These relax the body and mind. The key is to find one or more that you enjoy and use the techniques regularly. Examples include: ? Meditation, deep breathing, or progressive relaxation techniques. ? Yoga or tai chi. ? Biofeedback, mindfulness techniques, or journaling. ? Listening to music, being out in nature, or participating in other hobbies.  Practicing a healthy lifestyle. Eat a balanced diet, drink plenty of water, limit or avoid caffeine, and get plenty of sleep.  Having a strong support network. Spend time with family, friends, or other people you enjoy being around. Express your feelings and talk things over with someone you trust. Counseling or talk therapy with a mental health professional may be helpful if you are having trouble managing stress on your own.   Follow these instructions at home: Lifestyle  Avoid drugs.  Do not use any products that contain nicotine or tobacco, such as cigarettes, e-cigarettes, and chewing tobacco. If you need help quitting, ask your health care provider.  Limit alcohol intake to no more than 1 drink a day for nonpregnant women and 2 drinks a day for men. One drink equals 12 oz of beer, 5 oz of wine, or 1 oz of hard liquor  Do not use alcohol or drugs to relax.  Eat a balanced diet that includes fresh fruits and vegetables, whole grains, lean meats,  fish, eggs, and beans, and low-fat dairy. Avoid processed foods and foods high in added fat, sugar, and salt.  Exercise at least 30 minutes on 5 or more days each week.  Get 7-8 hours of sleep each night.   General instructions  Practice stress management techniques as discussed with your health care provider.  Drink enough fluid to keep your urine clear or pale yellow.  Take over-the-counter and prescription medicines only as told by your health care provider.  Keep all follow-up visits as told by your health care provider. This is important.   Contact a health care provider if:  Your symptoms get worse.  You have new symptoms.  You feel overwhelmed by your problems and can no longer manage them on your own. Get help right away if:  You have thoughts of hurting yourself or others. If you ever feel like you may hurt yourself or others, or have thoughts about taking your own life, get help right away. You can go to your nearest emergency department or call:  Your local emergency services (911 in the [place]  A suicide crisis helpline, such as the [place] at (907) 494-9283. This is open 24 hours a day. Summary  Stress is a normal reaction to life events. It can cause problems if it happens too often or for too long.  Practicing stress management techniques is the best way to treat stress.  Counseling or talk therapy with a mental health professional may be helpful if you are having trouble managing stress on your own. This information is not intended to replace advice given to you by your health care provider. Make sure you discuss any questions you have with your health care provider. Document Revised: 02/16/2020 Document Reviewed: 02/16/2020 Elsevier Patient Education  2021 [employer].

## 2020-08-23 NOTE — Progress Notes (Signed)
Subjective:     Alexis Vega is a 35 y.o. female and is here for a comprehensive physical exam. The patient reports decreased energy, weight gain increased stress.  Otherwise doing okay.  Social History   Socioeconomic History  . Marital status: Single    Spouse name: Not on file  . Number of children: Not on file  . Years of education: 91  . Highest education level: Not on file  Occupational History  . Occupation: Imaging professional  Tobacco Use  . Smoking status: Never Smoker  . Smokeless tobacco: Never Used  Substance and Sexual Activity  . Alcohol use: Yes    Comment: social  . Drug use: No  . Sexual activity: Yes  Other Topics Concern  . Not on file  Social History Narrative  . Not on file   Social Determinants of Health   Financial Resource Strain: Not on file  Food Insecurity: Not on file  Transportation Needs: Not on file  Physical Activity: Not on file  Stress: Not on file  Social Connections: Not on file  Intimate Partner Violence: Not on file   Health Maintenance  Topic Date Due  . Hepatitis C Screening  Never done  . HIV Screening  Never done  . TETANUS/TDAP  06/15/2012  . PAP SMEAR-Modifier  05/21/2014  . COVID-19 Vaccine (3 - Pfizer risk 4-dose series) 09/08/2020 (Originally 04/04/2020)  . INFLUENZA VACCINE  09/12/2020 (Originally 01/14/2020)  . HPV VACCINES  Aged Out    The following portions of the patient's history were reviewed and updated as appropriate: allergies, current medications, past family history, past medical history, past social history, past surgical history and problem list.  Review of Systems Pertinent items noted in HPI and remainder of comprehensive ROS otherwise negative.   Objective:    BP 116/78   Pulse 75   Temp 99.1 F (37.3 C) (Oral)   Ht 5' 7.25" (1.708 m)   Wt (!) 350 lb (158.8 kg)   SpO2 96%   BMI 54.41 kg/m  General appearance: alert, cooperative and no distress Head: Normocephalic, without  obvious abnormality, atraumatic Eyes: conjunctivae/corneas clear. PERRL, EOM's intact. Fundi benign. Ears: normal TM's and external ear canals both ears Nose: Nares normal. Septum midline. Mucosa normal. No drainage or sinus tenderness. Throat: lips, mucosa, and tongue normal; teeth and gums normal Neck: no adenopathy, no carotid bruit, no JVD, supple, symmetrical, trachea midline and thyromegaly Lungs: clear to auscultation bilaterally Heart: regular rate and rhythm, S1, S2 normal, no murmur, click, rub or gallop Abdomen: soft, non-tender; bowel sounds normal; no masses,  no organomegaly Extremities: extremities normal, atraumatic, no cyanosis or edema Pulses: 2+ and symmetric Skin: Skin color, texture, turgor normal. No rashes or lesions Lymph nodes: Cervical, supraclavicular, and axillary nodes normal. Neurologic: Alert and oriented X 3, normal strength and tone. Normal symmetric reflexes. Normal coordination and gait    Assessment:    Healthy female exam.      Plan:     Anticipatory guidance given including wearing seatbelts, smoke detectors in the home, increasing physical activity, increasing p.o. intake of water and vegetables. -will obtain labs -March 2020 at [facility] See After Visit Summary for Counseling Recommendations    Stress -PHQ-9 score 1 -Discussed self-care - Plan: CBC with Differential/Platelet, Comprehensive metabolic panel  Decreased energy  -Discussed possible causes including vitamin deficiency, electrolyte deficiency weight gain -Discussed lifestyle modifications - Plan: TSH, T4, free, Vitamin D, 25-hydroxy, Vitamin B12  Class 3 severe obesity due  to excess calories without serious comorbidity with body mass index (BMI) of 50.0 to 59.9 in adult [facility]) -Discussed lifestyle modifications and other ways to lose weight -Patient encouraged to increase physical activity.  Consider chair exercises or water aerobics -We will continue to monitor  -  Plan: CBC with Differential/Platelet, Comprehensive metabolic panel, Lipid panel, TSH, T4, free, Hemoglobin A1c  Follow-up as needed in the next few months   Abbe Amsterdam, MD

## 2020-08-26 ENCOUNTER — Other Ambulatory Visit: Payer: Self-pay | Admitting: Family Medicine

## 2020-08-26 DIAGNOSIS — E559 Vitamin D deficiency, unspecified: Secondary | ICD-10-CM | POA: Insufficient documentation

## 2020-08-26 MED ORDER — VITAMIN D (ERGOCALCIFEROL) 1.25 MG (50000 UNIT) PO CAPS: 50000.0000 [IU] | ORAL_CAPSULE | ORAL | 0 refills | Status: DC

## 2020-08-27 NOTE — Progress Notes (Signed)
Spoke with pt, is aware of results. Stated she will call back to schedule nurse visit for vit B12.

## 2020-09-02 ENCOUNTER — Ambulatory Visit (INDEPENDENT_AMBULATORY_CARE_PROVIDER_SITE_OTHER): Payer: No Typology Code available for payment source

## 2020-09-02 ENCOUNTER — Other Ambulatory Visit: Payer: Self-pay

## 2020-09-02 DIAGNOSIS — R5383 Other fatigue: Secondary | ICD-10-CM | POA: Diagnosis not present

## 2020-09-02 MED ORDER — CYANOCOBALAMIN 1000 MCG/ML IJ SOLN
1000.0000 ug | Freq: Once | INTRAMUSCULAR | Status: AC
  Administered 2020-09-02: 1000 ug via INTRAMUSCULAR

## 2020-09-02 NOTE — Progress Notes (Signed)
Patient given vitamin B12 injection in Right deltoid, tolerated well.

## 2020-09-09 ENCOUNTER — Encounter: Payer: Self-pay | Admitting: Family Medicine

## 2020-10-04 ENCOUNTER — Other Ambulatory Visit: Payer: Self-pay

## 2020-10-04 ENCOUNTER — Ambulatory Visit (INDEPENDENT_AMBULATORY_CARE_PROVIDER_SITE_OTHER): Payer: No Typology Code available for payment source | Admitting: *Deleted

## 2020-10-04 DIAGNOSIS — E538 Deficiency of other specified B group vitamins: Secondary | ICD-10-CM

## 2020-10-04 MED ORDER — CYANOCOBALAMIN 1000 MCG/ML IJ SOLN
1000.0000 ug | Freq: Once | INTRAMUSCULAR | Status: AC
  Administered 2020-10-04: 1000 ug via INTRAMUSCULAR

## 2020-10-04 NOTE — Progress Notes (Signed)
Per orders of Dr. Banks, injection of Cyanocobalamin 1000mcg given by [place]hower A. °Patient tolerated injection well. ° °

## 2021-08-29 ENCOUNTER — Encounter: Payer: Self-pay | Admitting: Family Medicine

## 2021-08-29 ENCOUNTER — Ambulatory Visit (INDEPENDENT_AMBULATORY_CARE_PROVIDER_SITE_OTHER): Payer: No Typology Code available for payment source | Admitting: Family Medicine

## 2021-08-29 VITALS — BP 164/109 | HR 83 | Temp 99.1°F | Ht 67.5 in | Wt 362.2 lb

## 2021-08-29 DIAGNOSIS — E049 Nontoxic goiter, unspecified: Secondary | ICD-10-CM

## 2021-08-29 DIAGNOSIS — R030 Elevated blood-pressure reading, without diagnosis of hypertension: Secondary | ICD-10-CM

## 2021-08-29 DIAGNOSIS — Z1159 Encounter for screening for other viral diseases: Secondary | ICD-10-CM

## 2021-08-29 DIAGNOSIS — E559 Vitamin D deficiency, unspecified: Secondary | ICD-10-CM | POA: Diagnosis not present

## 2021-08-29 DIAGNOSIS — L918 Other hypertrophic disorders of the skin: Secondary | ICD-10-CM

## 2021-08-29 DIAGNOSIS — J302 Other seasonal allergic rhinitis: Secondary | ICD-10-CM

## 2021-08-29 DIAGNOSIS — Z0000 Encounter for general adult medical examination without abnormal findings: Secondary | ICD-10-CM

## 2021-08-29 LAB — COMPREHENSIVE METABOLIC PANEL
ALT: 24 U/L (ref 0–35)
AST: 25 U/L (ref 0–37)
Albumin: 4 g/dL (ref 3.5–5.2)
Alkaline Phosphatase: 65 U/L (ref 39–117)
BUN: 15 mg/dL (ref 6–23)
CO2: 29 mEq/L (ref 19–32)
Calcium: 9.5 mg/dL (ref 8.4–10.5)
Chloride: 100 mEq/L (ref 96–112)
Creatinine, Ser: 0.98 mg/dL (ref 0.40–1.20)
GFR: 73.75 mL/min (ref >60.00)
Glucose, Bld: 85 mg/dL (ref 70–99)
Potassium: 4.2 mEq/L (ref 3.5–5.1)
Sodium: 137 mEq/L (ref 135–145)
Total Bilirubin: 0.3 mg/dL (ref 0.2–1.2)
Total Protein: 7.6 g/dL (ref 6.0–8.3)

## 2021-08-29 LAB — CBC WITH DIFFERENTIAL/PLATELET
Basophils Absolute: 0 10*3/uL (ref 0.0–0.1)
Basophils Relative: 0.4 % (ref 0.0–3.0)
Eosinophils Absolute: 0.2 10*3/uL (ref 0.0–0.7)
Eosinophils Relative: 2.4 % (ref 0.0–5.0)
HCT: 36.9 % (ref 36.0–46.0)
Hemoglobin: 12.3 g/dL (ref 12.0–15.0)
Lymphocytes Relative: 29.2 % (ref 12.0–46.0)
Lymphs Abs: 2.7 10*3/uL (ref 0.7–4.0)
MCHC: 33.3 g/dL (ref 30.0–36.0)
MCV: 83.5 fl (ref 78.0–100.0)
Monocytes Absolute: 0.7 10*3/uL (ref 0.1–1.0)
Monocytes Relative: 7.4 % (ref 3.0–12.0)
Neutro Abs: 5.6 10*3/uL (ref 1.4–7.7)
Neutrophils Relative %: 60.6 % (ref 43.0–77.0)
Platelets: 222 10*3/uL (ref 150.0–400.0)
RBC: 4.41 Mil/uL (ref 3.87–5.11)
RDW: 14.2 % (ref 11.5–15.5)
WBC: 9.3 10*3/uL (ref 4.0–10.5)

## 2021-08-29 LAB — LIPID PANEL
Cholesterol: 146 mg/dL (ref 0–200)
HDL: 44.6 mg/dL (ref >39.00)
LDL Cholesterol: 80 mg/dL (ref 0–99)
NonHDL: 101.45
Total CHOL/HDL Ratio: 3
Triglycerides: 106 mg/dL (ref 0.0–149.0)
VLDL: 21.2 mg/dL (ref 0.0–40.0)

## 2021-08-29 LAB — VITAMIN D 25 HYDROXY (VIT D DEFICIENCY, FRACTURES): VITD: 18.51 ng/mL — ABNORMAL LOW (ref 30.00–100.00)

## 2021-08-29 LAB — HEMOGLOBIN A1C: Hgb A1c MFr Bld: 6 % (ref 4.6–6.5)

## 2021-08-29 LAB — TSH: TSH: 2.18 u[IU]/mL (ref 0.35–5.50)

## 2021-08-29 LAB — T4, FREE: Free T4: 0.84 ng/dL (ref 0.60–1.60)

## 2021-08-29 MED ORDER — CETIRIZINE HCL 10 MG PO TABS: 10.0000 mg | ORAL_TABLET | Freq: Every day | ORAL | 0 refills | Status: DC

## 2021-08-29 NOTE — Progress Notes (Signed)
Subjective:  ?  ? Alexis Vega is a 36 y.o. female and is here for a comprehensive physical exam. The patient concerned about wt.  Trying not to eat late, but at times eats around 9 pm if works 2nd job.  Pt mostly sedentary during the day as working from home.  Previously on phentermine.  Pt inquires about removal of skin tags on L side of neck.  Endorses increased allergy symptoms.  May take OTC med prn. ? ?Social History  ? ?Socioeconomic History  ? Marital status: Single  ?  Spouse name: Not on file  ? Number of children: Not on file  ? Years of education: 18  ? Highest education level: Not on file  ?Occupational History  ? Occupation: Imaging professional  ?Tobacco Use  ? Smoking status: Never  ? Smokeless tobacco: Never  ?Substance and Sexual Activity  ? Alcohol use: Yes  ?  Comment: social  ? Drug use: No  ? Sexual activity: Yes  ?Other Topics Concern  ? Not on file  ?Social History Narrative  ? Not on file  ? ?Social Determinants of Health  ? ?Financial Resource Strain: Not on file  ?Food Insecurity: Not on file  ?Transportation Needs: Not on file  ?Physical Activity: Not on file  ?Stress: Not on file  ?Social Connections: Not on file  ?Intimate Partner Violence: Not on file  ? ?Health Maintenance  ?Topic Date Due  ? HIV Screening  Never done  ? Hepatitis C Screening  Never done  ? TETANUS/TDAP  06/15/2012  ? PAP SMEAR-Modifier  05/21/2014  ? COVID-19 Vaccine (3 - Pfizer risk series) 04/04/2020  ? INFLUENZA VACCINE  09/12/2021 (Originally 01/13/2021)  ? HPV VACCINES  Aged Out  ? ? ?The following portions of the patient's history were reviewed and updated as appropriate: allergies, current medications, past family history, past medical history, past social history, past surgical history, and problem list. ? ?Review of Systems ?Pertinent items noted in HPI and remainder of comprehensive ROS otherwise negative.  ? ?Objective:  ? ? BP (!) 186/102 (BP Location: Left Arm, Patient Position: Sitting, Cuff  Size: Normal)   Pulse 83   Temp 99.1 ?F (37.3 ?C) (Oral)   Ht 5' 7.5" (1.715 m)   Wt (!) 362 lb 3.2 oz (164.3 kg)   LMP 08/13/2021   SpO2 100%   BMI 55.89 kg/m?  ?General appearance: alert, cooperative, and no distress ?Head: Normocephalic, without obvious abnormality, atraumatic ?Eyes: conjunctivae/corneas clear. PERRL, EOM's intact. Fundi benign. ?Ears: normal TM's and external ear canals both ears ?Nose: Nares normal. Septum midline. Mucosa normal. No drainage or sinus tenderness. ?Throat: lips, mucosa, and tongue normal; teeth and gums normal ?Neck: no adenopathy, no carotid bruit, no JVD, supple, symmetrical, trachea midline, and thyroid not enlarged, symmetric, no tenderness/mass/nodules ?Lungs: clear to auscultation bilaterally ?Heart: regular rate and rhythm, S1, S2 normal, no murmur, click, rub or gallop ?Abdomen: soft, non-tender; bowel sounds normal; no masses,  no organomegaly ?Extremities: extremities normal, atraumatic, no cyanosis or edema ?Pulses: 2+ and symmetric ?Skin: warm, dry, intact.  Skin tags on L lateral neck, acanthosis nigricans ?Lymph nodes: Cervical, supraclavicular, and axillary nodes normal. ?Neurologic: Alert and oriented X 3, normal strength and tone. Normal symmetric reflexes. Normal coordination and gait  ?  ?Assessment:  ? ? Healthy female exam with morbid obesity   ?  ?Plan:  ? ? Anticipatory guidance given including wearing seatbelts, smoke detectors in the home, increasing physical activity, increasing p.o. intake of  water and vegetables. ?-labs ?-pap with OB/Gyn ?-Mammogram and colonoscopy not indicated 2/2 age ?-Immunizations reviewed. ?-Given handout ?-Next CPE in 1 year ?See After Visit Summary for Counseling Recommendations  ? ?Elevated blood-pressure reading without diagnosis of hypertension ?-BP recheck remained elevated at 160/99 ?-Discussed lifestyle modifications ?-Patient encouraged to check BP at home and keep a log to bring with her to clinic ?-For continued  elevation greater than 140/90 start medication ?-We will have patient follow-up in 1 month for BP recheck ?- Plan: CBC with Differential/Platelet, TSH, T4, Free, CMP ? ?Morbid obesity ([place]) ?-Body mass index is 55.89 kg/m?. ?-Discussed importance of lifestyle modifications ?-Discussed medication options.  Patient wishes to wait at this time.  Will reevaluate at next [facility] for BP follow-up ?- Plan: TSH, T4, Free, Hemoglobin A1c, Lipid panel ? ?Goiter  ?- Plan: TSH, T4, Free ? ?Vitamin D deficiency  ?-Vitamin D this visit 18.51 ?-We will start ergocalciferol 50,000 IU weekly x12 weeks. ?- Plan: Vitamin D, 25-hydroxy, Vitamin D, Ergocalciferol, (DRISDOL) 1.25 MG (50000 UNIT) CAPS capsule ? ?Encounter for hepatitis C screening test for low risk patient  ?- Plan: Hep C Antibody ? ?Skin tags, multiple acquired ?-Discussed removal options in clinic versus dermatology ?-Patient to decide. ? ?Seasonal allergies ?-Continue OTC antihistamines ?-Consider nasal saline rinse or Flonase ? ?F/u in 1 month for bp and wt check ? ?Grier Mitts, DM ? ?

## 2021-09-01 LAB — HEPATITIS C ANTIBODY
Hepatitis C Ab: NONREACTIVE
SIGNAL TO CUT-OFF: <0.02 (ref <1.00)

## 2021-09-01 MED ORDER — VITAMIN D (ERGOCALCIFEROL) 1.25 MG (50000 UNIT) PO CAPS: 50000.0000 [IU] | ORAL_CAPSULE | ORAL | 0 refills | Status: DC

## 2022-02-02 IMAGING — CR DG LUMBAR SPINE COMPLETE 4+V
5 series · 5 of 5 positions shown · non-contrast
Comparison: None.

CLINICAL DATA: Back pain, radicular symptoms

EXAM:
LUMBAR SPINE - COMPLETE 4+ VIEW

[t lumbar spine ap]
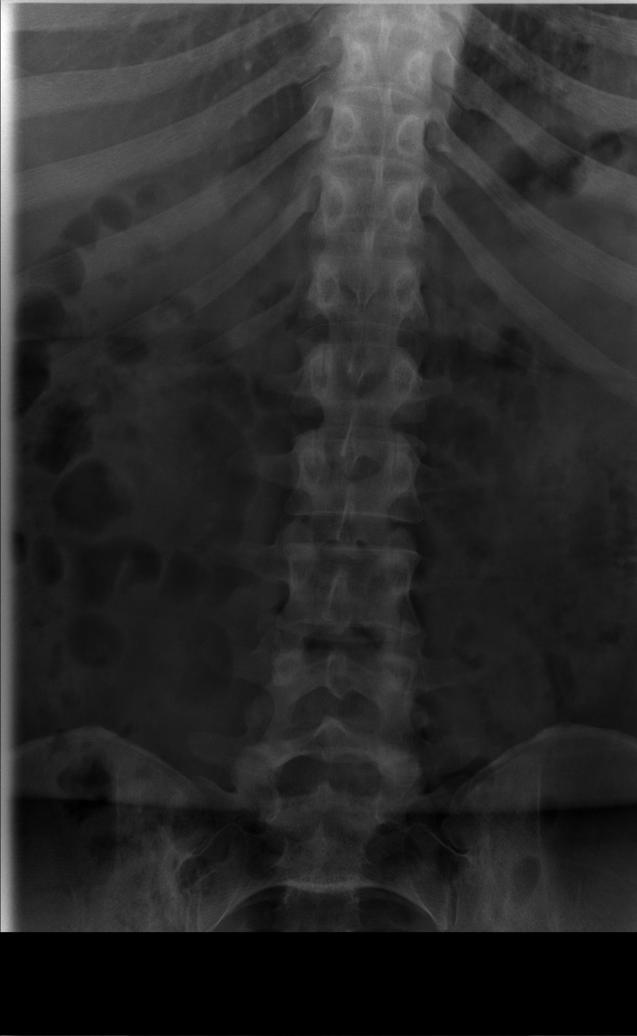

[t lumbar spine obl (1 of 2)]
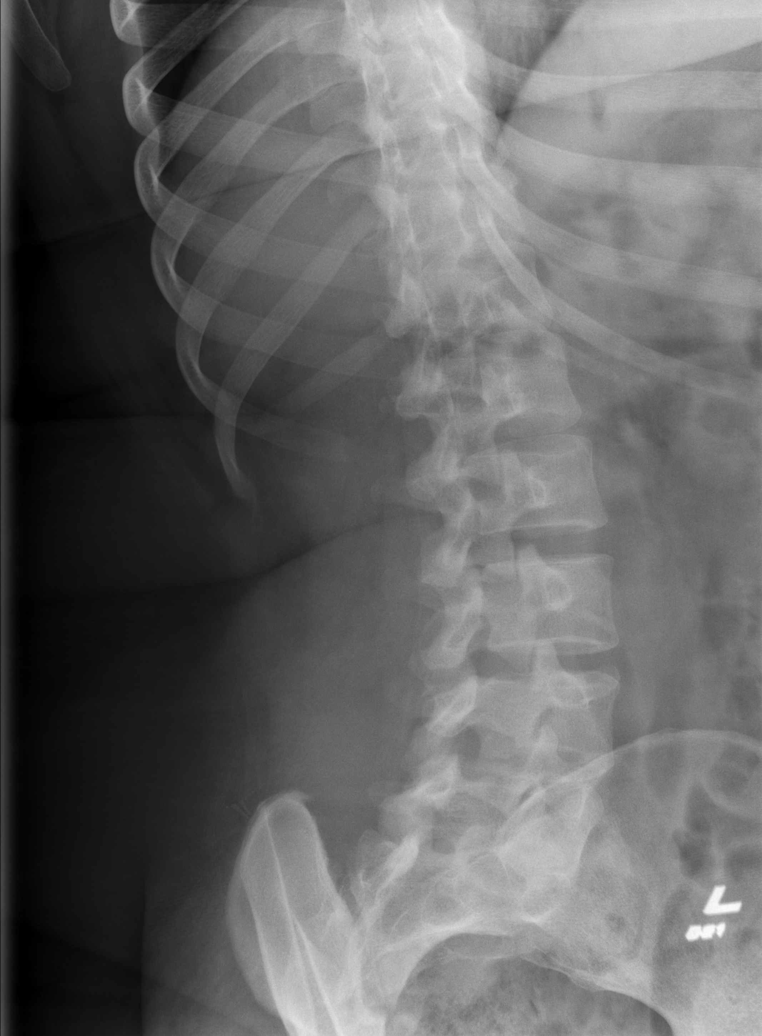

[t lumbar spine obl (2 of 2)]
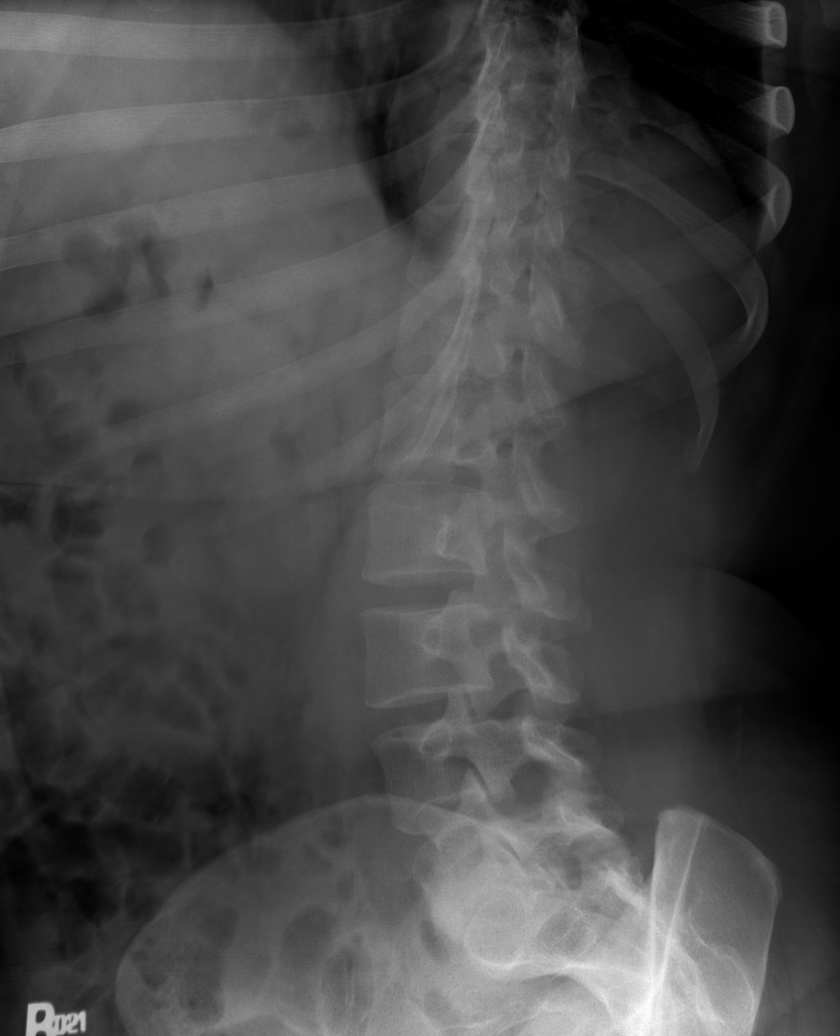

[t lumbar spine lat]
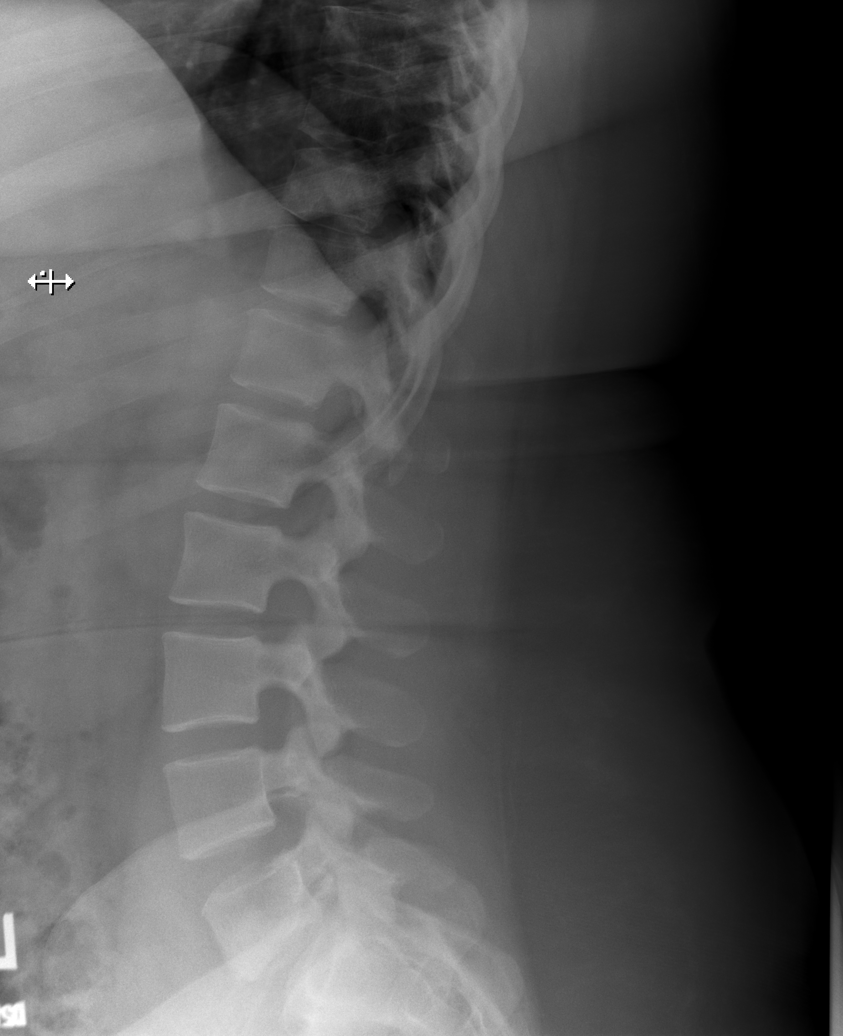

[t lumbar l-5 s-1 spot]
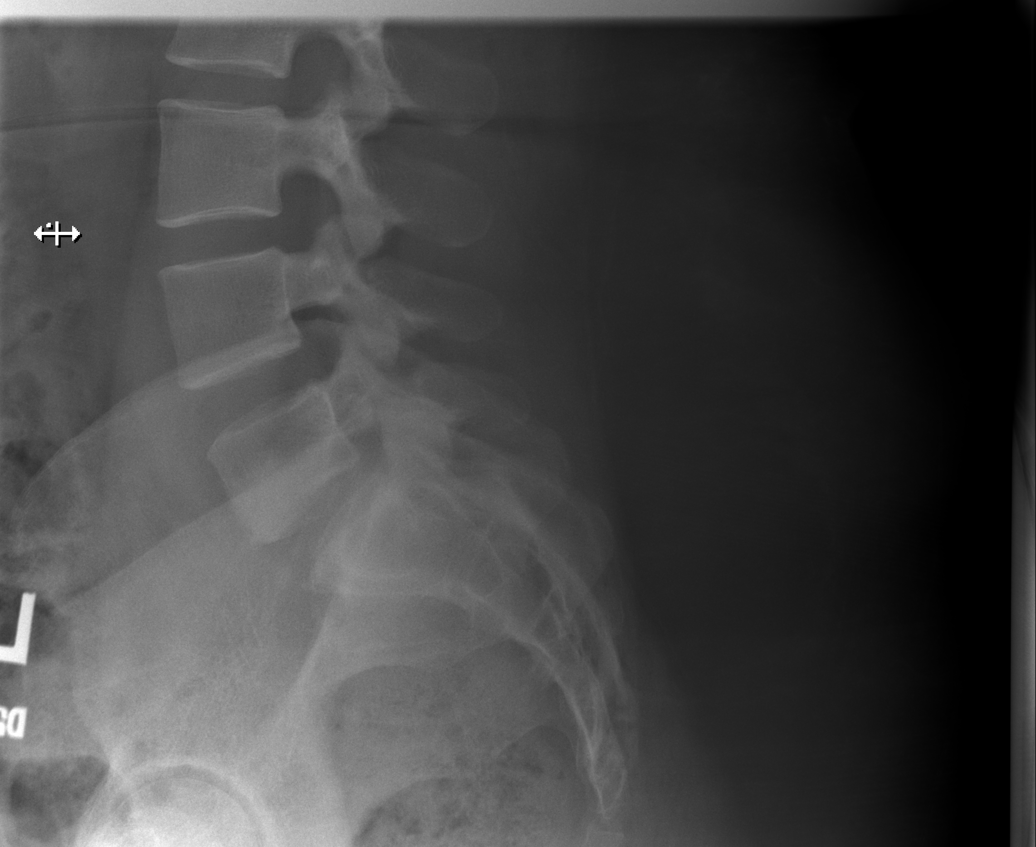

[5 of 5 positions shown; findings below may reference images not displayed]

FINDINGS: No acute fracture vertebral body height loss. No spondylolysis or
spondylolisthesis. Mild facet degenerative changes noted L5-S1.
Included portions of the bony pelvis are unremarkable. Soft tissues
are free of acute abnormality.
IMPRESSION: No acute bony abnormality. Mild facet degenerative changes at L5-S1.

## 2022-09-02 ENCOUNTER — Encounter: Payer: Self-pay | Admitting: Family Medicine

## 2022-09-02 ENCOUNTER — Ambulatory Visit: Payer: No Typology Code available for payment source | Admitting: Family Medicine

## 2022-09-02 VITALS — BP 140/90 | HR 79 | Temp 98.8°F | Ht 67.0 in | Wt 354.7 lb

## 2022-09-02 DIAGNOSIS — R7303 Prediabetes: Secondary | ICD-10-CM

## 2022-09-02 DIAGNOSIS — Z0000 Encounter for general adult medical examination without abnormal findings: Secondary | ICD-10-CM | POA: Diagnosis not present

## 2022-09-02 DIAGNOSIS — E559 Vitamin D deficiency, unspecified: Secondary | ICD-10-CM | POA: Diagnosis not present

## 2022-09-02 DIAGNOSIS — I10 Essential (primary) hypertension: Secondary | ICD-10-CM

## 2022-09-02 DIAGNOSIS — G8929 Other chronic pain: Secondary | ICD-10-CM

## 2022-09-02 DIAGNOSIS — E049 Nontoxic goiter, unspecified: Secondary | ICD-10-CM

## 2022-09-02 DIAGNOSIS — R519 Headache, unspecified: Secondary | ICD-10-CM | POA: Diagnosis not present

## 2022-09-02 LAB — COMPREHENSIVE METABOLIC PANEL
ALT: 16 U/L (ref 0–35)
AST: 19 U/L (ref 0–37)
Albumin: 4 g/dL (ref 3.5–5.2)
Alkaline Phosphatase: 71 U/L (ref 39–117)
BUN: 15 mg/dL (ref 6–23)
CO2: 28 mEq/L (ref 19–32)
Calcium: 9.6 mg/dL (ref 8.4–10.5)
Chloride: 101 mEq/L (ref 96–112)
Creatinine, Ser: 1.01 mg/dL (ref 0.40–1.20)
GFR: 70.63 mL/min (ref >60.00)
Glucose, Bld: 84 mg/dL (ref 70–99)
Potassium: 4.6 mEq/L (ref 3.5–5.1)
Sodium: 137 mEq/L (ref 135–145)
Total Bilirubin: 0.3 mg/dL (ref 0.2–1.2)
Total Protein: 7.6 g/dL (ref 6.0–8.3)

## 2022-09-02 LAB — CBC WITH DIFFERENTIAL/PLATELET
Basophils Absolute: 0 10*3/uL (ref 0.0–0.1)
Basophils Relative: 0.5 % (ref 0.0–3.0)
Eosinophils Absolute: 0.2 10*3/uL (ref 0.0–0.7)
Eosinophils Relative: 2.1 % (ref 0.0–5.0)
HCT: 39.7 % (ref 36.0–46.0)
Hemoglobin: 12.9 g/dL (ref 12.0–15.0)
Lymphocytes Relative: 28.9 % (ref 12.0–46.0)
Lymphs Abs: 2.6 10*3/uL (ref 0.7–4.0)
MCHC: 32.5 g/dL (ref 30.0–36.0)
MCV: 83.5 fl (ref 78.0–100.0)
Monocytes Absolute: 0.5 10*3/uL (ref 0.1–1.0)
Monocytes Relative: 5.8 % (ref 3.0–12.0)
Neutro Abs: 5.7 10*3/uL (ref 1.4–7.7)
Neutrophils Relative %: 62.7 % (ref 43.0–77.0)
Platelets: 258 10*3/uL (ref 150.0–400.0)
RBC: 4.76 Mil/uL (ref 3.87–5.11)
RDW: 14.6 % (ref 11.5–15.5)
WBC: 9.1 10*3/uL (ref 4.0–10.5)

## 2022-09-02 LAB — LIPID PANEL
Cholesterol: 165 mg/dL (ref 0–200)
HDL: 46.1 mg/dL (ref >39.00)
LDL Cholesterol: 99 mg/dL (ref 0–99)
NonHDL: 118.55
Total CHOL/HDL Ratio: 4
Triglycerides: 99 mg/dL (ref 0.0–149.0)
VLDL: 19.8 mg/dL (ref 0.0–40.0)

## 2022-09-02 LAB — HEMOGLOBIN A1C: Hgb A1c MFr Bld: 6 % (ref 4.6–6.5)

## 2022-09-02 LAB — T4, FREE: Free T4: 0.96 ng/dL (ref 0.60–1.60)

## 2022-09-02 LAB — VITAMIN D 25 HYDROXY (VIT D DEFICIENCY, FRACTURES): VITD: 18.98 ng/mL — ABNORMAL LOW (ref 30.00–100.00)

## 2022-09-02 LAB — TSH: TSH: 1.8 u[IU]/mL (ref 0.35–5.50)

## 2022-09-02 MED ORDER — RYBELSUS 7 MG PO TABS: 7.0000 mg | ORAL_TABLET | Freq: Every day | ORAL | 2 refills | Status: AC

## 2022-09-02 NOTE — Progress Notes (Signed)
Established Patient Office Visit   Subjective  Patient ID: Alexis Vega, female    DOB: [birth year]  Age: 37 y.o. MRN: [id number]  No chief complaint on file.   Pt is a 37 yo female with pmh sig for obesity, pre DM, vit D def, who was seen for CPE.  Pt states she has been doing well overall.  Has noticed bp come down.  Having headaches a few times per wk.  Was unsure if related to bp or possible allergies.  Has a dull HA in R parietal area, but just got hair braided over the wknd.  Pt going to bed at 10 pm but waking up at 2 am unable to go back to sleep until 4 AM and up at 530.  May happen twice per week.  Will look at phone while awake at 2 AM.  Unsure if worried about things she needs to do for the next day.  Drinking a soda at lunch and green tea in the evening.  Had Rx for Rybelsus 7 mg but needs refill.  Patient had pap last year, plans to schedule another.      ROS Negative unless stated above    Objective:     BP (!) 140/90 (BP Location: Left Arm, Patient Position: Sitting, Cuff Size: Normal)   Pulse 79   Temp 98.8 F (37.1 C) (Oral)   Ht 5\' 7"  (1.702 m)   Wt (!) 354 lb 11.2 oz (160.9 kg)   SpO2 100%   BMI 55.55 kg/m    Physical Exam Constitutional:      Appearance: Normal appearance.  HENT:     Head: Normocephalic and atraumatic.     Right Ear: Tympanic membrane, ear canal and external ear normal.     Left Ear: Tympanic membrane, ear canal and external ear normal.     Nose: Nose normal.     Mouth/Throat:     Mouth: Mucous membranes are moist.     Pharynx: No oropharyngeal exudate or posterior oropharyngeal erythema.  Eyes:     General: No scleral icterus.    Extraocular Movements: Extraocular movements intact.     Conjunctiva/sclera: Conjunctivae normal.     Pupils: Pupils are equal, round, and reactive to light.  Neck:     Thyroid: Thyromegaly present.     Vascular: No carotid bruit or JVD.  Cardiovascular:     Rate and Rhythm: Normal rate  and regular rhythm.     Pulses: Normal pulses.     Heart sounds: Normal heart sounds. No murmur heard.    No friction rub.  Pulmonary:     Effort: Pulmonary effort is normal.     Breath sounds: Normal breath sounds. No wheezing, rhonchi or rales.  Abdominal:     General: Bowel sounds are normal.     Palpations: Abdomen is soft.     Tenderness: There is no abdominal tenderness.  Musculoskeletal:        General: No deformity. Normal range of motion.  Lymphadenopathy:     Cervical: No cervical adenopathy.  Skin:    General: Skin is warm and dry.     Findings: No lesion.  Neurological:     General: No focal deficit present.     Mental Status: She is alert and oriented to person, place, and time.  Psychiatric:        Mood and Affect: Mood normal.        Thought Content: Thought content normal.  No results found for any visits on 09/02/22.    Assessment & Plan:  Well adult exam -Anticipatory guidance given including wearing seatbelts, smoke detectors in the home, increasing physical activity, increasing p.o. intake of water and vegetables. -labs -pap done last yr with OB/Gyn -immunizations reviewed. -next CPE in 1 yr  Essential hypertension -bp elevated -Continue lifestyle modifications -For consistent elevations greater than 140/90 at home start medication -     Comprehensive metabolic panel -     TSH -     T4, free -     Lipid panel  Morbid obesity (HCC) -Body mass index is 55.55 kg/m. -Congratulated on 8 pound weight loss -Continue lifestyle modification and current medications -     Hemoglobin A1c -     Rybelsus; Take 1 tablet (7 mg total) by mouth daily.  Dispense: 90 tablet; Refill: 2  Vitamin D deficiency -     VITAMIN D 25 Hydroxy (Vit-D Deficiency, Fractures)  Chronic nonintractable headache, unspecified headache type -Discussed headache prevention and sleep hygiene -     Comprehensive metabolic panel -     CBC with Differential/Platelet -      TSH -     T4, free  Goiter -     TSH -     T4, free  Prediabetes -Hemoglobin A1c 6.0% on 08/29/2021 -     Hemoglobin A1c   Return in about 3 months (around 12/03/2022) for blood pressure re-check.   Billie Ruddy, MD

## 2022-09-03 ENCOUNTER — Other Ambulatory Visit: Payer: Self-pay | Admitting: Family Medicine

## 2022-09-03 DIAGNOSIS — E559 Vitamin D deficiency, unspecified: Secondary | ICD-10-CM

## 2022-09-03 MED ORDER — VITAMIN D (ERGOCALCIFEROL) 1.25 MG (50000 UNIT) PO CAPS: 50000.0000 [IU] | ORAL_CAPSULE | ORAL | 0 refills | Status: AC

## 2022-09-15 ENCOUNTER — Telehealth: Payer: Self-pay | Admitting: Pharmacy Technician

## 2022-09-15 NOTE — Telephone Encounter (Signed)
Patient Advocate Encounter   Received notification that prior authorization for Rybelsus 7MG  tablets is required.   PA submitted on 09/15/2022 [place] Electronic PA Form Status is pending

## 2022-09-17 NOTE — Telephone Encounter (Signed)
Pharmacy Patient Advocate Encounter  Received notification from [facility] that the request for prior authorization for Rybelsus 7MG  tablets has been denied due to patient not having type 2 diabetes mellitus.       Please be advised we currently do not have a Pharmacist to review denials, therefore you will need to process appeals accordingly as needed. Thanks for your support at this time.   You may fax [phone] to appeal.

## 2022-09-18 NOTE — Telephone Encounter (Signed)
Ok.  Was given a rx for Rybelsusl in the past from a previous provider, but never filled it.  Was re-written at last visit.  Please let pt know of denial.

## 2022-09-18 NOTE — Telephone Encounter (Signed)
Per pt insurance this is only covered for type 2 dm. Pt could of had another insurance or maybe previous PA was done differently. Tried to call pt multiple times but no answer.

## 2022-09-25 NOTE — Telephone Encounter (Signed)
Ok

## 2022-10-14 ENCOUNTER — Other Ambulatory Visit: Payer: Self-pay

## 2022-10-14 ENCOUNTER — Emergency Department (HOSPITAL_COMMUNITY)
Admission: EM | Admit: 2022-10-14 | Discharge: 2022-10-14 | Disposition: A | Discharge status: Home / Self Care | Payer: No Typology Code available for payment source | Attending: Emergency Medicine | Admitting: Emergency Medicine

## 2022-10-14 DIAGNOSIS — R42 Dizziness and giddiness: Secondary | ICD-10-CM

## 2022-10-14 LAB — COMPREHENSIVE METABOLIC PANEL
ALT: 21 U/L (ref 0–44)
AST: 27 U/L (ref 15–41)
Albumin: 3.7 g/dL (ref 3.5–5.0)
Alkaline Phosphatase: 60 U/L (ref 38–126)
Anion gap: 9 (ref 5–15)
BUN: 16 mg/dL (ref 6–20)
CO2: 24 mmol/L (ref 22–32)
Calcium: 9.1 mg/dL (ref 8.9–10.3)
Chloride: 103 mmol/L (ref 98–111)
Creatinine, Ser: 1.12 mg/dL — ABNORMAL HIGH (ref 0.44–1.00)
GFR, Estimated: >60 mL/min (ref >60)
Glucose, Bld: 116 mg/dL — ABNORMAL HIGH (ref 70–99)
Potassium: 3.9 mmol/L (ref 3.5–5.1)
Sodium: 136 mmol/L (ref 135–145)
Total Bilirubin: 0.5 mg/dL (ref 0.3–1.2)
Total Protein: 7.7 g/dL (ref 6.5–8.1)

## 2022-10-14 LAB — URINALYSIS, ROUTINE W REFLEX MICROSCOPIC
Bilirubin Urine: NEGATIVE
Glucose, UA: NEGATIVE mg/dL
Hgb urine dipstick: NEGATIVE
Ketones, ur: NEGATIVE mg/dL
Leukocytes,Ua: NEGATIVE
Nitrite: NEGATIVE
Protein, ur: NEGATIVE mg/dL
Specific Gravity, Urine: 1.012 (ref 1.005–1.030)
pH: 7 (ref 5.0–8.0)

## 2022-10-14 LAB — CBC
HCT: 40.3 % (ref 36.0–46.0)
Hemoglobin: 12.8 g/dL (ref 12.0–15.0)
MCH: 27.1 pg (ref 26.0–34.0)
MCHC: 31.8 g/dL (ref 30.0–36.0)
MCV: 85.2 fL (ref 80.0–100.0)
Platelets: 233 10*3/uL (ref 150–400)
RBC: 4.73 MIL/uL (ref 3.87–5.11)
RDW: 14.5 % (ref 11.5–15.5)
WBC: 9.5 10*3/uL (ref 4.0–10.5)
nRBC: 0 % (ref 0.0–0.2)

## 2022-10-14 LAB — I-STAT BETA HCG BLOOD, ED (MC, WL, AP ONLY): I-stat hCG, quantitative: <5 m[IU]/mL (ref <5)

## 2022-10-14 MED ORDER — MECLIZINE HCL 25 MG PO TABS: 25.0000 mg | ORAL_TABLET | Freq: Three times a day (TID) | ORAL | 0 refills | Status: AC | PRN

## 2022-10-14 MED ORDER — SODIUM CHLORIDE 0.9 % IV BOLUS
1000.0000 mL | Freq: Once | INTRAVENOUS | Status: AC
  Administered 2022-10-14: 1000 mL via INTRAVENOUS

## 2022-10-14 MED ORDER — MECLIZINE HCL 25 MG PO TABS
25.0000 mg | ORAL_TABLET | Freq: Once | ORAL | Status: AC
  Administered 2022-10-14: 25 mg via ORAL
  Filled 2022-10-14: qty 1

## 2022-10-14 NOTE — ED Triage Notes (Signed)
Pt arrives to ED via EMS c/o dizziness since 0230. Pt reports that she woke up with dizziness with no relief after walking and drinking water. Pt a/o at this moment and endorses hx of seizures

## 2022-10-14 NOTE — Discharge Instructions (Addendum)
Follow-up with your primary care doctor.  Make sure you are drinking plenty of water eating regular meals and taking medications.  Take the meclizine as needed as prescribed for vertigo.  Any slurred speech, confusion, headache or blurry vision or visual field loss or any numbness or weakness in any extremity associated with this vertigo should immediately prompted to come to the emergency room

## 2022-10-14 NOTE — ED Notes (Signed)
Pt resting comfortably at this time.

## 2022-10-14 NOTE — ED Provider Notes (Signed)
[place] EMERGENCY DEPARTMENT AT [facility] Provider Note   CSN: [id number] Arrival date & time: 10/14/22  0451     History  Chief Complaint  Alexis Vega presents with   Dizziness    Alexis Antorniette Vega is a 37 y.o. female.   Dizziness  Alexis Vega is a 37 year old female with no pertinent past medical history apart from seizures when she was a child and morbid obesity  She is present emergency room today with complaints of vertigo.  She states that she went to sleep around 10:15 PM and woke up around 2:30 AM with persistent vertigo.  She states that this got somewhat worse when she stood up but then became better when she laid back down.  She denies any vomiting but does state that she feels somewhat nauseous currently but only mildly so.  No abdominal pain, no headache, no blurry vision double vision no extremity weakness or numbness no slurred speech or confusion she denies any vision changes.  She denies any head trauma.  She states her symptoms have somewhat improved from initial symptom onset at 2:30 AM.  She has not taken any medications for her symptoms she states that she did have an episode of vertiginous symptoms approximately 1 year ago that resolved approximately 1 hour after it began.     Home Medications Prior to Admission medications   Medication Sig Start Date End Date Taking? Authorizing Provider  acetaminophen (TYLENOL 8 HOUR) 650 MG CR tablet Take 1 tablet (650 mg total) by mouth every 8 (eight) hours as needed. 09/26/19   Derwood Kaplan, MD  ibuprofen (ADVIL) 200 MG tablet Advil 200 mg tablet  Take 1 tablet every 6 hours by oral route.    [provider]  Semaglutide (RYBELSUS) 7 MG TABS Take 1 tablet (7 mg total) by mouth daily. 09/02/22   Deeann Saint, MD  Vitamin D, Ergocalciferol, (DRISDOL) 1.25 MG (50000 UNIT) CAPS capsule Take 1 capsule (50,000 Units total) by mouth every 7 (seven) days. 09/03/22   Deeann Saint, MD       Allergies    Shrimp [shellfish allergy] and Strawberry extract    Review of Systems   Review of Systems  Neurological:  Positive for dizziness.    Physical Exam Updated Vital Signs BP (!) 137/92   Pulse 73   Temp 98 F (36.7 C)   Resp 17   Ht 5\' 8"  (1.727 m)   Wt (!) 143.3 kg   SpO2 100%   BMI 48.05 kg/m  Physical Exam Vitals and nursing note reviewed.  Constitutional:      General: She is not in acute distress. HENT:     Head: Normocephalic and atraumatic.     Nose: Nose normal.  Eyes:     General: No scleral icterus. Cardiovascular:     Rate and Rhythm: Normal rate and regular rhythm.     Pulses: Normal pulses.     Heart sounds: Normal heart sounds.  Pulmonary:     Effort: Pulmonary effort is normal. No respiratory distress.     Breath sounds: No wheezing.  Abdominal:     Palpations: Abdomen is soft.     Tenderness: There is no abdominal tenderness.  Musculoskeletal:     Cervical back: Normal range of motion.     Right lower leg: No edema.     Left lower leg: No edema.  Skin:    General: Skin is warm and dry.     Capillary Refill: Capillary refill  takes less than 2 seconds.  Neurological:     Mental Status: She is alert. Mental status is at baseline.     Comments: Alert and oriented to self, place, time and event.   Speech is fluent, clear without dysarthria or dysphasia.   Strength 5/5 in upper/lower extremities   Sensation intact in upper/lower extremities   CN I not tested  CN II grossly intact visual fields bilaterally. Did not visualize posterior eye.  CN III, IV, VI PERRLA and EOMs intact bilaterally  CN V Intact sensation to sharp and light touch to the face  CN VII facial movements symmetric  CN VIII not tested  CN IX, X no uvula deviation, symmetric rise of soft palate  CN XI 5/5 SCM and trapezius strength bilaterally  CN XII Midline tongue protrusion, symmetric L/R movements   Psychiatric:        Mood and Affect: Mood normal.         Behavior: Behavior normal.     ED Results / Procedures / Treatments   Labs (all labs ordered are listed, but only abnormal results are displayed) Labs Reviewed  CBC  COMPREHENSIVE METABOLIC PANEL  URINALYSIS, ROUTINE W REFLEX MICROSCOPIC  I-STAT BETA HCG BLOOD, ED ([facility], [facility], [facility] ONLY)    EKG EKG Interpretation  Date/Time:  Wednesday Oct 14 2022 05:01:50 EDT Ventricular Rate:  81 PR Interval:  175 QRS Duration: 83 QT Interval:  379 QTC Calculation: 440 R Axis:   50 Text Interpretation: Sinus rhythm Consider left atrial enlargement Low voltage, precordial leads No previous ECGs available Confirmed by Zadie Rhine ([id number]) on 10/14/2022 5:06:26 AM  Radiology No results found.  Procedures Procedures    Medications Ordered in ED Medications  meclizine (ANTIVERT) tablet 25 mg (has no administration in time range)  sodium chloride 0.9 % bolus 1,000 mL (has no administration in time range)    ED Course/ Medical Decision Making/ A&P Clinical Course as of 10/14/22 0527  Wed Oct 14, 2022  0514 Woke up w vertigo and states she felt very off balance. She states her sx have mostly resolved. Denies any weakness or numbness. No vision changes. NV or pain.  No syncope or near syncope.  Wake 2:30 am 10am sleep.  [WF]    Clinical Course User Index [WF] Gailen Shelter, [place]                             Medical Decision Making Amount and/or Complexity of Data Reviewed Labs: ordered.   This Alexis Vega presents to the ED for concern of room spinning, this involves a number of treatment options, and is a complaint that carries with it a moderate risk of complications and morbidity. A differential diagnosis was considered for the Alexis Vega's symptoms which is discussed below:   The differential diagnosis for vertigo includes but is not limited to: By timeline. Seconds: positioning vertigo (cupulolithiasis), vertebrobasilar insufficiency, cervical vertigo (head-extension vertigo),  diplopia Hours: recurrent vestibulopathy (Mnire's disease without auditory symptoms), vestibular migraine Days: vestibular neuronitis, head trauma Months: vertebrobasilar insufficiency, arteriovenous malformation, brainstem or cerebellar tumor, cerebellar degeneration, multiple sclerosis, vertebrobasilar migraine Drugs: Anticonvulsants (eg, phenytoin), antibiotics (eg, aminoglycosides, doxycycline, metronidazole), hypnotics (eg, diazepam), analgesics (eg, aspirin), alcohol    Co morbidities: Discussed in HPI   Brief History:  Alexis Vega is a 37 year old female with no pertinent past medical history apart from seizures when she was a child and morbid obesity  She is present emergency room today  with complaints of vertigo.  She states that she went to sleep around 10:15 PM and woke up around 2:30 AM with persistent vertigo.  She states that this got somewhat worse when she stood up but then became better when she laid back down.  She denies any vomiting but does state that she feels somewhat nauseous currently but only mildly so.  No abdominal pain, no headache, no blurry vision double vision no extremity weakness or numbness no slurred speech or confusion she denies any vision changes.  She denies any head trauma.  She states her symptoms have somewhat improved from initial symptom onset at 2:30 AM.  She has not taken any medications for her symptoms she states that she did have an episode of vertiginous symptoms approximately 1 year ago that resolved approximately 1 hour after it began.    EMR reviewed including pt PMHx, past surgical history and past visits to ER.   See HPI for more details   Lab Tests:   I ordered and independently interpreted labs. Labs notable for CBC and CMP unremarkable i-STAT hCG negative for pregnancy urinalysis without signs of infection.  Imaging Studies:  No imaging studies ordered for this Alexis Vega    Cardiac Monitoring:  The Alexis Vega was maintained  on a cardiac monitor.  I personally viewed and interpreted the cardiac monitored which showed an underlying rhythm of: NSR EKG non-ischemic   Medicines ordered:  I ordered medication including meclizine, 1 L normal saline for dizziness Reevaluation of the Alexis Vega after these medicines showed that the Alexis Vega resolved I have reviewed the patients home medicines and have made adjustments as needed   Critical Interventions:     Consults/Attending Physician      Reevaluation:  After the interventions noted above I re-evaluated Alexis Vega and found that they have :resolved   Social Determinants of Health:      Problem List / ED Course:  Alexis Vega with vertiginous symptoms that resolved after some fluids and meclizine here.  It does sound more like vertigo rather than lightheadedness.  Will provide her with some meclizine to go home with and recommend follow-up with her primary care provider.  Doubt posterior stroke, MS, acoustic neuroma or other central cause of her vertigo as it does seem to be positional.  It is now completely resolved she is ambulatory without difficulty.   Dispostion:  After consideration of the diagnostic results and the patients response to treatment, I feel that the patent would benefit from discharge  Final Clinical Impression(s) / ED Diagnoses Final diagnoses:  Vertigo    Rx / DC Orders ED Discharge Orders          Ordered    meclizine (ANTIVERT) 25 MG tablet  3 times daily PRN        10/14/22 0656              Gailen Shelter, PA 10/15/22 1610    Zadie Rhine, MD 10/16/22 1009

## 2022-11-11 ENCOUNTER — Encounter: Payer: Self-pay | Admitting: Family Medicine

## 2022-11-21 LAB — HM PAP SMEAR: HPV, high-risk: NEGATIVE

## 2022-11-23 ENCOUNTER — Encounter: Payer: Self-pay | Admitting: Obstetrics & Gynecology

## 2022-12-03 ENCOUNTER — Ambulatory Visit: Payer: No Typology Code available for payment source | Admitting: Family Medicine
